# Patient Record
Sex: Female | Born: 1960 | Race: Black or African American | Hispanic: No | Marital: Single | Smoking: Never smoker
Health system: Southern US, Community
[De-identification: ages and names within clinical notes are randomized; demographics above are authoritative.]

## PROBLEM LIST (undated history)

## (undated) DIAGNOSIS — I10 Essential (primary) hypertension: Secondary | ICD-10-CM

---

## 2008-04-01 ENCOUNTER — Emergency Department: Payer: Self-pay | Admitting: Emergency Medicine

## 2008-04-08 ENCOUNTER — Ambulatory Visit: Payer: Self-pay | Admitting: Family Medicine

## 2013-10-15 ENCOUNTER — Emergency Department: Payer: Self-pay | Admitting: Emergency Medicine

## 2013-10-15 LAB — BASIC METABOLIC PANEL
ANION GAP: 5 — AB (ref 7–16)
BUN: 13 mg/dL (ref 7–18)
CHLORIDE: 106 mmol/L (ref 98–107)
Calcium, Total: 8.5 mg/dL (ref 8.5–10.1)
Co2: 26 mmol/L (ref 21–32)
Creatinine: 0.62 mg/dL (ref 0.60–1.30)
EGFR (African American): >60
EGFR (Non-African Amer.): >60
Glucose: 128 mg/dL — ABNORMAL HIGH (ref 65–99)
OSMOLALITY: 276 (ref 275–301)
POTASSIUM: 3.3 mmol/L — AB (ref 3.5–5.1)
SODIUM: 137 mmol/L (ref 136–145)

## 2013-10-15 LAB — CBC WITH DIFFERENTIAL/PLATELET
BASOS PCT: 0.7 %
Basophil #: 0.1 10*3/uL (ref 0.0–0.1)
EOS ABS: 0.1 10*3/uL (ref 0.0–0.7)
Eosinophil %: 0.9 %
HCT: 35.8 % (ref 35.0–47.0)
HGB: 12 g/dL (ref 12.0–16.0)
LYMPHS ABS: 2.2 10*3/uL (ref 1.0–3.6)
Lymphocyte %: 19 %
MCH: 29.5 pg (ref 26.0–34.0)
MCHC: 33.6 g/dL (ref 32.0–36.0)
MCV: 88 fL (ref 80–100)
MONO ABS: 0.9 x10 3/mm (ref 0.2–0.9)
Monocyte %: 8.2 %
Neutrophil #: 8.1 10*3/uL — ABNORMAL HIGH (ref 1.4–6.5)
Neutrophil %: 71.2 %
PLATELETS: 219 10*3/uL (ref 150–440)
RBC: 4.08 10*6/uL (ref 3.80–5.20)
RDW: 15.1 % — ABNORMAL HIGH (ref 11.5–14.5)
WBC: 11.3 10*3/uL — ABNORMAL HIGH (ref 3.6–11.0)

## 2013-10-17 LAB — BETA STREP CULTURE(ARMC)

## 2013-10-20 LAB — CULTURE, BLOOD (SINGLE)

## 2014-05-22 ENCOUNTER — Ambulatory Visit: Payer: Self-pay

## 2015-08-29 ENCOUNTER — Ambulatory Visit
Admission: EM | Admit: 2015-08-29 | Discharge: 2015-08-29 | Disposition: A | Discharge status: Home / Self Care | Payer: BLUE CROSS/BLUE SHIELD | Attending: Family Medicine | Admitting: Family Medicine

## 2015-08-29 ENCOUNTER — Ambulatory Visit (INDEPENDENT_AMBULATORY_CARE_PROVIDER_SITE_OTHER): Payer: BLUE CROSS/BLUE SHIELD

## 2015-08-29 DIAGNOSIS — M25531 Pain in right wrist: Secondary | ICD-10-CM | POA: Diagnosis not present

## 2015-08-29 DIAGNOSIS — S66911A Strain of unspecified muscle, fascia and tendon at wrist and hand level, right hand, initial encounter: Secondary | ICD-10-CM

## 2015-08-29 DIAGNOSIS — M778 Other enthesopathies, not elsewhere classified: Secondary | ICD-10-CM

## 2015-08-29 HISTORY — DX: Essential (primary) hypertension: I10

## 2015-08-29 MED ORDER — MELOXICAM 15 MG PO TABS: 15.0000 mg | ORAL_TABLET | Freq: Every day | ORAL | Status: DC

## 2015-08-29 MED ORDER — TRAMADOL HCL 50 MG PO TABS: 50.0000 mg | ORAL_TABLET | Freq: Two times a day (BID) | ORAL | Status: DC | PRN

## 2015-08-29 NOTE — ED Provider Notes (Signed)
CSN: [id number]     Arrival date & time 08/29/15  1348 History   First MD Initiated Contact with Patient 08/29/15 1622    Nurses notes were reviewed. Chief Complaint  Patient presents with  . Wrist Pain   patient started having right wrist pain while at work. She uses her hand and wrist. States that right wrist pain started bothering her while she was at work. Tylenol she did not file any paperwork at work. She had one friend who thought this had tendinitis but not a friend who disputed that could be an issue. The right wrist and distal forearm became warm and hot and the patient became concerned and came in to be seen and evaluated. She denies any specific injury to the right wrist but states she does use her wrist and hand a lot at work and sometimes she does hit her work table with the wrist and hand.  She denies any drug allergies she does not smoke. She has history of hypertension and father has a history of an unknown cancer.  (Consider location/radiation/quality/duration/timing/severity/associated sxs/prior Treatment) Patient is a 55 y.o. female presenting with wrist pain. The history is provided by the patient. No language interpreter was used.  Wrist Pain This is a new problem. The current episode started more than 2 days ago. The problem occurs constantly. The problem has been gradually worsening. Pertinent negatives include no chest pain, no abdominal pain, no headaches and no shortness of breath. The symptoms are aggravated by exertion and bending. Nothing relieves the symptoms. She has tried nothing for the symptoms. The treatment provided no relief.    Past Medical History  Diagnosis Date  . Hypertension    History reviewed. No pertinent past surgical history. Family History  Problem Relation Age of Onset  . Cancer Father    Social History  Substance Use Topics  . Smoking status: Never Smoker   . Smokeless tobacco: None  . Alcohol Use: No   OB History    No data  available     Review of Systems  Respiratory: Negative for shortness of breath.   Cardiovascular: Negative for chest pain.  Gastrointestinal: Negative for abdominal pain.  Neurological: Negative for headaches.  All other systems reviewed and are negative.   Allergies  Review of patient's allergies indicates no known allergies.  Home Medications   Prior to Admission medications   Medication Sig Start Date End Date Taking? Authorizing Provider  metoprolol-hydrochlorothiazide (LOPRESSOR HCT) 100-25 MG tablet Take 1 tablet by mouth daily.   Yes Historical Provider, MD  meloxicam (MOBIC) 15 MG tablet Take 1 tablet (15 mg total) by mouth daily. 08/29/15   Frederich Cha, MD  traMADol (ULTRAM) 50 MG tablet Take 1 tablet (50 mg total) by mouth every 12 (twelve) hours as needed for moderate pain or severe pain (Many take at night for pain may cause sedation during the day). 08/29/15   Frederich Cha, MD   Meds Ordered and Administered this Visit  Medications - No data to display  BP 157/108 mmHg  Pulse 80  Temp(Src) 98.1 F (36.7 C) (Tympanic)  Resp 18  Ht 5\' 4"  (1.626 m)  Wt 289 lb (131.09 kg)  BMI 49.58 kg/m2  SpO2 100%  LMP 08/12/2015 (Approximate) No data found.   Physical Exam  Constitutional: She is oriented to person, place, and time. She appears well-developed and well-nourished.  Non-toxic appearance. She does not have a sickly appearance.  Obese black female  HENT:  Head: Normocephalic and  atraumatic.  Eyes: Conjunctivae are normal. Pupils are equal, round, and reactive to light.  Musculoskeletal: She exhibits edema and tenderness.       Right wrist: She exhibits tenderness and swelling. She exhibits no deformity.       Arms: Patient has swelling in the right wrist. There is no tenderness over the anatomical snuffbox with right wrist but over the ulnar side and medial side of the right wrist and distal forearm past with the tenderness is at and is actually some swelling and  warmth in this area as well. X-ray of the right wrist though was negative for any type of occult fracture or dislocation.  Neurological: She is alert and oriented to person, place, and time.  Skin: Skin is warm and dry.  Psychiatric: She has a normal mood and affect.  Vitals reviewed.   ED Course  Procedures (including critical care time)  Labs Review Labs Reviewed - No data to display  Imaging Review Dg Wrist Complete Right  08/29/2015  CLINICAL DATA:  Pt with right ulnar/medial wrist pain x two days. No known trauma or injury. Pain with swelling. No hx of trauma or injury. No hx of surgery. EXAM: RIGHT WRIST - COMPLETE 3+ VIEW COMPARISON:  None. FINDINGS: There is no evidence of fracture or dislocation. There is no evidence of arthropathy or other focal bone abnormality. Soft tissues are unremarkable. IMPRESSION: Negative. Electronically Signed   By: Lajean Manes M.D.   On: 08/29/2015 15:30     Visual Acuity Review  Right Eye Distance:   Left Eye Distance:   Bilateral Distance:    Right Eye Near:   Left Eye Near:    Bilateral Near:         MDM   1. Wrist pain, acute, right   2. Tendonitis of wrist, right   3. Overuse syndrome of right hand, initial encounter    Pressure is elevate. Will give her a work note for Saturday Sunday Monday return to work on Tuesday. We'll place the cockup wrist splint. Placed on Mobic 15 mg and tramadol to use at night mostly to help with the pain in the wrist is interfering with her sleeping. If she is not better by Monday S tolerance test she contact the PCP of her choice to be this rechecked and reevaluated.    Frederich Cha, MD 08/29/15 [phone]

## 2015-08-29 NOTE — ED Notes (Signed)
Started Monday with right wrist pain. Improved slightly but Wednesday night right wrist began swelling "throbbing". Difficulty moving fingers or wrist. Denies trauma. No bite (insect) marks noted

## 2015-08-29 NOTE — Discharge Instructions (Signed)
Cryotherapy Cryotherapy is when you put ice on your injury. Ice helps lessen pain and puffiness (swelling) after an injury. Ice works the best when you start using it in the first 24 to 48 hours after an injury. HOME CARE  Put a dry or damp towel between the ice pack and your skin.  You may press gently on the ice pack.  Leave the ice on for no more than 10 to 20 minutes at a time.  Check your skin after 5 minutes to make sure your skin is okay.  Rest at least 20 minutes between ice pack uses.  Stop using ice when your skin loses feeling (numbness).  Do not use ice on someone who cannot tell you when it hurts. This includes small children and people with memory problems (dementia). GET HELP RIGHT AWAY IF:  You have white spots on your skin.  Your skin turns blue or pale.  Your skin feels waxy or hard.  Your puffiness gets worse. MAKE SURE YOU:   Understand these instructions.  Will watch your condition.  Will get help right away if you are not doing well or get worse.   This information is not intended to replace advice given to you by your health care provider. Make sure you discuss any questions you have with your health care provider.   Document Released: 12/14/2007 Document Revised: 09/19/2011 Document Reviewed: 02/17/2011 Elsevier Interactive Patient Education 2016 [place].  Muscle Strain A muscle strain (pulled muscle) happens when a muscle is stretched beyond normal length. It happens when a sudden, violent force stretches your muscle too far. Usually, a few of the fibers in your muscle are torn. Muscle strain is common in athletes. Recovery usually takes 1-2 weeks. Complete healing takes 5-6 weeks.  HOME CARE   Follow the PRICE method of treatment to help your injury get better. Do this the first 2-3 days after the injury:  Protect. Protect the muscle to keep it from getting injured again.  Rest. Limit your activity and rest the injured body part.  Ice.  Put ice in a plastic bag. Place a towel between your skin and the bag. Then, apply the ice and leave it on from 15-20 minutes each hour. After the third day, switch to moist heat packs.  Compression. Use a splint or elastic bandage on the injured area for comfort. Do not put it on too tightly.  Elevate. Keep the injured body part above the level of your heart.  Only take medicine as told by your doctor.  Warm up before doing exercise to prevent future muscle strains. GET HELP IF:   You have more pain or puffiness (swelling) in the injured area.  You feel numbness, tingling, or notice a loss of strength in the injured area. MAKE SURE YOU:   Understand these instructions.  Will watch your condition.  Will get help right away if you are not doing well or get worse.   This information is not intended to replace advice given to you by your health care provider. Make sure you discuss any questions you have with your health care provider.   Document Released: 04/05/2008 Document Revised: 04/17/2013 Document Reviewed: 01/24/2013 Elsevier Interactive Patient Education 2016 [employer].  Joint Pain Joint pain can be caused by many things. The joint can be bruised, infected, weak from aging, or sore from exercise. The pain will probably go away if you follow your doctor's instructions for home care. If your joint pain continues, more tests  may be needed to help find the cause of your condition. HOME CARE Watch your condition for any changes. Follow these instructions as told to lessen the pain that you are feeling:  Take medicines only as told by your doctor.  Rest the sore joint for as long as told by your doctor. If your doctor tells you to, raise (elevate) the painful joint above the level of your heart while you are sitting or lying down.  Do not do things that cause pain or make the pain worse.  If told, put ice on the painful area:  Put ice in a plastic bag.  Place a towel  between your skin and the bag.  Leave the ice on for 20 minutes, 2-3 times per day.  Wear an elastic bandage, splint, or sling as told by your doctor. Loosen the bandage or splint if your fingers or toes lose feeling (become numb) and tingle, or if they turn cold and blue.  Begin exercising or stretching the joint as told by your doctor. Ask your doctor what types of exercise are safe for you.  Keep all follow-up visits as told by your doctor. This is important. GET HELP IF:  Your pain gets worse and medicine does not help it.  Your joint pain does not get better in 3 days.  You have more bruising or swelling.  You have a fever.  You lose 10 pounds (4.5 kg) or more without trying. GET HELP RIGHT AWAY IF:  You are not able to move the joint.  Your fingers or toes become numb or they turn cold and blue.   This information is not intended to replace advice given to you by your health care provider. Make sure you discuss any questions you have with your health care provider.   Document Released: 06/15/2009 Document Revised: 07/18/2014 Document Reviewed: 04/08/2014 Elsevier Interactive Patient Education 2016 [employer]  Wrist Pain There are many things that can cause wrist pain. Some common causes include:  An injury to the wrist area.  Overuse of the joint.  A condition that causes too much pressure to be put on a nerve in the wrist (carpal tunnel syndrome).  Wear and tear of the joints that happens as a person gets older (osteoarthritis).  Other types of arthritis. Sometimes, the cause is not known. The pain often goes away when you follow instructions from your doctor about relieving pain at home. If your wrist pain does not go away, tests may need to be done to find the cause. HOME CARE Pay attention to any changes in your symptoms. Take these actions to help with your pain:  Rest your wrist for at least 48 hours or as told by your doctor.  If your doctor tells you  to, put ice on the injured area:  Put ice in a plastic bag.  Place a towel between your skin and the bag.  Leave the ice on for 20 minutes, 2-3 times per day.  Keep your arm raised (elevated) above the level of your heart while you are sitting or lying down.  If a splint or elastic bandage has been put on the injured area:  Wear it as told by your doctor.  Take the splint or bandage off only as told by your doctor.  Loosen the splint or bandage if your fingers lose feeling (are numb) or have a tingling feeling, or if they turn cold or blue.  Take over-the-counter and prescription medicines only as told by  your doctor.  Keep all follow-up visits as told by your doctor. This is important. GET HELP IF:  Your pain is not helped by treatment.  Your pain gets worse. GET HELP RIGHT AWAY IF:   Your fingers swell.  Your fingers turn white, very red, or cold and blue.  Your fingers lose feeling or have a tingling feeling.  You have trouble moving your fingers.   This information is not intended to replace advice given to you by your health care provider. Make sure you discuss any questions you have with your health care provider.   Document Released: 12/14/2007 Document Revised: 03/18/2015 Document Reviewed: 11/12/2014 Elsevier Interactive Patient Education [employer].

## 2016-02-12 ENCOUNTER — Encounter: Payer: Self-pay | Admitting: *Deleted

## 2016-02-15 ENCOUNTER — Encounter
Admission: RE | Disposition: A | Discharge status: Home / Self Care | Payer: Self-pay | Source: Ambulatory Visit | Attending: Gastroenterology

## 2016-02-15 ENCOUNTER — Encounter: Payer: Self-pay | Admitting: Anesthesiology

## 2016-02-15 ENCOUNTER — Ambulatory Visit
Admission: RE | Admit: 2016-02-15 | Discharge: 2016-02-15 | Disposition: A | Discharge status: Home / Self Care | Payer: 59 | Source: Ambulatory Visit | Attending: Gastroenterology | Admitting: Gastroenterology

## 2016-02-15 ENCOUNTER — Ambulatory Visit: Payer: 59 | Admitting: Certified Registered Nurse Anesthetist

## 2016-02-15 DIAGNOSIS — Z79899 Other long term (current) drug therapy: Secondary | ICD-10-CM | POA: Insufficient documentation

## 2016-02-15 DIAGNOSIS — I10 Essential (primary) hypertension: Secondary | ICD-10-CM | POA: Insufficient documentation

## 2016-02-15 DIAGNOSIS — K640 First degree hemorrhoids: Secondary | ICD-10-CM | POA: Diagnosis not present

## 2016-02-15 DIAGNOSIS — Z800 Family history of malignant neoplasm of digestive organs: Secondary | ICD-10-CM | POA: Diagnosis not present

## 2016-02-15 DIAGNOSIS — Z1211 Encounter for screening for malignant neoplasm of colon: Secondary | ICD-10-CM | POA: Diagnosis not present

## 2016-02-15 DIAGNOSIS — D123 Benign neoplasm of transverse colon: Secondary | ICD-10-CM | POA: Insufficient documentation

## 2016-02-15 DIAGNOSIS — K5730 Diverticulosis of large intestine without perforation or abscess without bleeding: Secondary | ICD-10-CM | POA: Diagnosis not present

## 2016-02-15 DIAGNOSIS — K621 Rectal polyp: Secondary | ICD-10-CM | POA: Diagnosis not present

## 2016-02-15 HISTORY — PX: COLONOSCOPY WITH PROPOFOL: SHX5780

## 2016-02-15 SURGERY — COLONOSCOPY WITH PROPOFOL
Anesthesia: General

## 2016-02-15 MED ORDER — LIDOCAINE HCL (CARDIAC) 20 MG/ML IV SOLN
INTRAVENOUS | Status: DC | PRN
  Administered 2016-02-15: 20 mg via INTRAVENOUS

## 2016-02-15 MED ORDER — SODIUM CHLORIDE 0.9 % IV SOLN: INTRAVENOUS | Status: DC

## 2016-02-15 MED ORDER — SODIUM CHLORIDE 0.9 % IV SOLN
INTRAVENOUS | Status: DC
  Administered 2016-02-15: 1000 mL via INTRAVENOUS

## 2016-02-15 MED ORDER — GLYCOPYRROLATE 0.2 MG/ML IJ SOLN
INTRAMUSCULAR | Status: DC | PRN
  Administered 2016-02-15: 0.2 mg via INTRAVENOUS

## 2016-02-15 MED ORDER — PROPOFOL 500 MG/50ML IV EMUL
INTRAVENOUS | Status: DC | PRN
  Administered 2016-02-15: 140 ug/kg/min via INTRAVENOUS

## 2016-02-15 MED ORDER — PROPOFOL 10 MG/ML IV BOLUS
INTRAVENOUS | Status: DC | PRN
  Administered 2016-02-15 (×3): 20 mg via INTRAVENOUS

## 2016-02-15 MED ORDER — DEXMEDETOMIDINE HCL IN NACL 200 MCG/50ML IV SOLN
INTRAVENOUS | Status: DC | PRN
  Administered 2016-02-15 (×2): 8 ug via INTRAVENOUS
  Administered 2016-02-15: 4 ug via INTRAVENOUS

## 2016-02-15 NOTE — Op Note (Signed)
[facility] Gastroenterology Patient Name: Tammy Mahoney Procedure Date: 02/15/2016 9:54 AM MRN: [id number] Account #: [id number] Date of Birth: February 05, 1961 Admit Type: Outpatient Age: 55 Room: [facility] ENDO ROOM 4 Gender: Female Note Status: Finalized Procedure:            Colonoscopy Indications:          Family history of colon cancer in a first-degree                        relative Providers:            Lollie Sails, MD Referring MD:         Rubbie Battiest. Iona Beard MD, MD (Referring MD) Medicines:            Monitored Anesthesia Care Complications:        No immediate complications. Procedure:            Pre-Anesthesia Assessment:                       - ASA Grade Assessment: III - A patient with severe                        systemic disease.                       After obtaining informed consent, the colonoscope was                        passed under direct vision. Throughout the procedure,                        the patient's blood pressure, pulse, and oxygen                        saturations were monitored continuously. The                        Colonoscope was introduced through the anus and                        advanced to the the cecum, identified by appendiceal                        orifice and ileocecal valve. The colonoscopy was                        performed without difficulty. The patient tolerated the                        procedure well. The quality of the bowel preparation                        was fair. Findings:      A 3 mm polyp was found in the hepatic flexure. The polyp was sessile.       The polyp was removed with a cold biopsy forceps. Resection and       retrieval were complete.      Two sessile polyps were found in the rectum. The polyps were 1 to 2 mm       in size. These polyps were removed with a cold biopsy forceps.  Resection       and retrieval were complete.      A few small-mouthed diverticula were found in the sigmoid  colon.      Non-bleeding internal hemorrhoids were found during anoscopy. The       hemorrhoids were small and Grade I (internal hemorrhoids that do not       prolapse). Impression:           - Preparation of the colon was fair.                       - One 3 mm polyp at the hepatic flexure, removed with a                        cold biopsy forceps. Resected and retrieved.                       - Two 1 to 2 mm polyps in the rectum, removed with a                        cold biopsy forceps. Resected and retrieved.                       - Diverticulosis in the sigmoid colon.                       - Non-bleeding internal hemorrhoids. Recommendation:       - Await pathology results.                       - Telephone GI clinic for pathology results in 1 week. Procedure Code(s):    --- Professional ---                       [id number], Colonoscopy, flexible; with biopsy, single or                        multiple Diagnosis Code(s):    --- Professional ---                       K64.0, First degree hemorrhoids                       D12.3, Benign neoplasm of transverse colon (hepatic                        flexure or splenic flexure)                       K62.1, Rectal polyp                       Z80.0, Family history of malignant neoplasm of                        digestive organs                       K57.30, Diverticulosis of large intestine without                        perforation or abscess without bleeding CPT copyright 2016 American Medical Association. All rights  reserved. The codes documented in this report are preliminary and upon coder review may  be revised to meet current compliance requirements. Lollie Sails, MD 02/15/2016 10:27:21 AM This report has been signed electronically. Number of Addenda: 0 Note Initiated On: 02/15/2016 9:54 AM Scope Withdrawal Time: 0 hours 8 minutes 45 seconds  Total Procedure Duration: 0 hours 18 minutes 59 seconds       [facility]

## 2016-02-15 NOTE — Anesthesia Preprocedure Evaluation (Signed)
Anesthesia Evaluation  Patient identified by MRN, date of birth, ID band Patient awake    Reviewed: Allergy & Precautions, H&P , NPO status , Patient's Chart, lab work & pertinent test results, reviewed documented beta blocker date and time   History of Anesthesia Complications Negative for: history of anesthetic complications  Airway Mallampati: I  TM Distance: >3 FB Neck ROM: full    Dental no notable dental hx. (+) Missing, Poor Dentition   Pulmonary neg pulmonary ROS,    Pulmonary exam normal breath sounds clear to auscultation       Cardiovascular Exercise Tolerance: Good hypertension, (-) angina(-) CAD, (-) Past MI, (-) Cardiac Stents and (-) CABG Normal cardiovascular exam(-) dysrhythmias (-) Valvular Problems/Murmurs Rhythm:regular Rate:Normal     Neuro/Psych negative neurological ROS  negative psych ROS   GI/Hepatic negative GI ROS, Neg liver ROS,   Endo/Other  neg diabetes  Renal/GU negative Renal ROS  negative genitourinary   Musculoskeletal   Abdominal   Peds  Hematology negative hematology ROS (+)   Anesthesia Other Findings Past Medical History: No date: Hypertension   Reproductive/Obstetrics negative OB ROS                             Anesthesia Physical Anesthesia Plan  ASA: III  Anesthesia Plan: General   Post-op Pain Management:    Induction:   Airway Management Planned:   Additional Equipment:   Intra-op Plan:   Post-operative Plan:   Informed Consent: I have reviewed the patients History and Physical, chart, labs and discussed the procedure including the risks, benefits and alternatives for the proposed anesthesia with the patient or authorized representative who has indicated his/her understanding and acceptance.   Dental Advisory Given  Plan Discussed with: Anesthesiologist, CRNA and Surgeon  Anesthesia Plan Comments:         Anesthesia  Quick Evaluation

## 2016-02-15 NOTE — Transfer of Care (Signed)
Immediate Anesthesia Transfer of Care Note  Patient: Tammy Mahoney  Procedure(s) Performed: Procedure(s): COLONOSCOPY WITH PROPOFOL (N/A)  Patient Location: PACU  Anesthesia Type:General  Level of Consciousness: awake  Airway & Oxygen Therapy: Patient connected to nasal cannula oxygen  Post-op Assessment: Report given to RN  Post vital signs: stable  Last Vitals:  Vitals:   02/15/16 0933 02/15/16 1029  BP: (!) 159/95 132/63  Pulse: 89 83  Resp: 16 17  Temp: 36.8 C 36.8 C    Last Pain:  Vitals:   02/15/16 1029  TempSrc: Tympanic         Complications: No apparent anesthesia complications

## 2016-02-15 NOTE — Anesthesia Postprocedure Evaluation (Signed)
Anesthesia Post Note  Patient: LANNA XU  Procedure(s) Performed: Procedure(s) (LRB): COLONOSCOPY WITH PROPOFOL (N/A)  Patient location during evaluation: Endoscopy Anesthesia Type: General Level of consciousness: awake and alert Pain management: pain level controlled Vital Signs Assessment: post-procedure vital signs reviewed and stable Respiratory status: spontaneous breathing, nonlabored ventilation, respiratory function stable and patient connected to nasal cannula oxygen Cardiovascular status: blood pressure returned to baseline and stable Postop Assessment: no signs of nausea or vomiting Anesthetic complications: no    Last Vitals:  Vitals:   02/15/16 1040 02/15/16 1100  BP:  (!) 149/81  Pulse:    Resp: 12   Temp:      Last Pain:  Vitals:   02/15/16 1029  TempSrc: Tympanic                 Martha Clan

## 2016-02-15 NOTE — H&P (Signed)
Outpatient short stay form Pre-procedure 02/15/2016 9:48 AM Tammy Sails MD  Primary Physician: Dr. Salome Holmes  Reason for visit:  Colonoscopy  History of present illness:  Patient is a 55 year old female presenting today for a colonoscopy. Her brother has a history of colon cancer. This is patient's first colonoscopy. She did tolerate her prep well. She takes no aspirin or blood thinning products.    Current Facility-Administered Medications:  .  0.9 %  sodium chloride infusion, , Intravenous, Continuous, Tammy Sails, MD, Last Rate: 20 mL/hr at 02/15/16 0946, 1,000 mL at 02/15/16 0946 .  0.9 %  sodium chloride infusion, , Intravenous, Continuous, Tammy Sails, MD  Prescriptions Prior to Admission  Medication Sig Dispense Refill Last Dose  . amLODipine (NORVASC) 5 MG tablet Take 5 mg by mouth daily.   02/15/2016 at [facility]  . metoprolol-hydrochlorothiazide (LOPRESSOR HCT) 100-25 MG tablet Take 1 tablet by mouth daily.   Past Week at Unknown time  . meloxicam (MOBIC) 15 MG tablet Take 1 tablet (15 mg total) by mouth daily. (Patient not taking: Reported on 02/15/2016) 30 tablet 0 Not Taking at Unknown time  . traMADol (ULTRAM) 50 MG tablet Take 1 tablet (50 mg total) by mouth every 12 (twelve) hours as needed for moderate pain or severe pain (Many take at night for pain may cause sedation during the day). (Patient not taking: Reported on 02/15/2016) 15 tablet 0 Not Taking at Unknown time     No Known Allergies   Past Medical History:  Diagnosis Date  . Hypertension     Review of systems:      Physical Exam    Heart and lungs: Regular rate and rhythm without rub or gallop, lungs are bilaterally clear    HEENT: Normocephalic atraumatic eyes are anicteric    Other:     Pertinant exam for procedure: Soft nontender nondistended bowel sounds positive normoactive.    Planned proceedures: Colonoscopy and indicated procedures I have discussed the risks benefits and  complications of procedures to include not limited to bleeding, infection, perforation and the risk of sedation and the patient wishes to proceed.    Tammy Sails, MD Gastroenterology 02/15/2016  9:48 AM

## 2016-02-16 ENCOUNTER — Encounter: Payer: Self-pay | Admitting: Gastroenterology

## 2016-02-16 LAB — SURGICAL PATHOLOGY

## 2017-05-05 ENCOUNTER — Encounter: Payer: Self-pay | Admitting: *Deleted

## 2017-05-05 ENCOUNTER — Ambulatory Visit
Admission: EM | Admit: 2017-05-05 | Discharge: 2017-05-05 | Disposition: A | Discharge status: Home / Self Care | Payer: 59 | Attending: Family Medicine | Admitting: Family Medicine

## 2017-05-05 DIAGNOSIS — J039 Acute tonsillitis, unspecified: Secondary | ICD-10-CM

## 2017-05-05 DIAGNOSIS — J020 Streptococcal pharyngitis: Secondary | ICD-10-CM

## 2017-05-05 LAB — RAPID STREP SCREEN (MED CTR MEBANE ONLY): STREPTOCOCCUS, GROUP A SCREEN (DIRECT): POSITIVE — AB

## 2017-05-05 MED ORDER — LIDOCAINE VISCOUS 2 % MT SOLN: OROMUCOSAL | 0 refills | Status: DC

## 2017-05-05 MED ORDER — AMOXICILLIN 875 MG PO TABS: 875.0000 mg | ORAL_TABLET | Freq: Two times a day (BID) | ORAL | 0 refills | Status: DC

## 2017-05-05 NOTE — ED Triage Notes (Signed)
Patient started having symptom of sore throat yesterday.

## 2017-05-05 NOTE — ED Provider Notes (Signed)
[facility]    CSN: [id number] Arrival date & time: 05/05/17  1010     History   Chief Complaint Chief Complaint  Patient presents with  . Sore Throat    HPI Tammy Mahoney is a 56 y.o. female.   The history is provided by the patient.  Sore Throat  This is a new problem. The current episode started yesterday. The problem occurs constantly. The problem has not changed since onset.Pertinent negatives include no chest pain, no abdominal pain, no headaches and no shortness of breath. The symptoms are aggravated by swallowing. The symptoms are relieved by acetaminophen.    Past Medical History:  Diagnosis Date  . Hypertension     There are no active problems to display for this patient.   Past Surgical History:  Procedure Laterality Date  . COLONOSCOPY WITH PROPOFOL N/A 02/15/2016   Procedure: COLONOSCOPY WITH PROPOFOL;  Surgeon: Lollie Sails, MD;  Location: [facility] ENDOSCOPY;  Service: Endoscopy;  Laterality: N/A;    OB History    No data available       Home Medications    Prior to Admission medications   Medication Sig Start Date End Date Taking? Authorizing Provider  amLODipine (NORVASC) 5 MG tablet Take 5 mg by mouth daily.   Yes [provider]  metoprolol-hydrochlorothiazide (LOPRESSOR HCT) 100-25 MG tablet Take 1 tablet by mouth daily.   Yes [provider]  amoxicillin (AMOXIL) 875 MG tablet Take 1 tablet (875 mg total) by mouth 2 (two) times daily. 05/05/17   Norval Gable, MD  lidocaine (XYLOCAINE) 2 % solution 20 ml gargle and spit q 6 hours as needed 05/05/17   Norval Gable, MD  meloxicam (MOBIC) 15 MG tablet Take 1 tablet (15 mg total) by mouth daily. Patient not taking: Reported on 02/15/2016 08/29/15   Frederich Cha, MD  traMADol (ULTRAM) 50 MG tablet Take 1 tablet (50 mg total) by mouth every 12 (twelve) hours as needed for moderate pain or severe pain (Many take at night for pain may cause sedation during the  day). Patient not taking: Reported on 02/15/2016 08/29/15   Frederich Cha, MD    Family History Family History  Problem Relation Age of Onset  . Cancer Father     Social History Social History  Substance Use Topics  . Smoking status: Never Smoker  . Smokeless tobacco: Never Used  . Alcohol use No     Allergies   Patient has no known allergies.   Review of Systems Review of Systems  Respiratory: Negative for shortness of breath.   Cardiovascular: Negative for chest pain.  Gastrointestinal: Negative for abdominal pain.  Neurological: Negative for headaches.     Physical Exam Triage Vital Signs ED Triage Vitals  Enc Vitals Group     BP 05/05/17 1033 (!) 149/87     Pulse Rate 05/05/17 1033 92     Resp 05/05/17 1033 16     Temp 05/05/17 1033 98.5 F (36.9 C)     Temp Source 05/05/17 1033 Oral     SpO2 05/05/17 1033 96 %     Weight 05/05/17 1035 283 lb (128.4 kg)     Height 05/05/17 1035 5\' 4"  (1.626 m)     Head Circumference --      Peak Flow --      Pain Score 05/05/17 1035 5     Pain Loc --      Pain Edu? --      Excl. in  [place]? --    No data found.   Updated Vital Signs BP (!) 149/87 (BP Location: Left Arm)   Pulse 92   Temp 98.5 F (36.9 C) (Oral)   Resp 16   Ht 5\' 4"  (1.626 m)   Wt 283 lb (128.4 kg)   LMP 04/05/2017   SpO2 96%   BMI 48.58 kg/m   Visual Acuity Right Eye Distance:   Left Eye Distance:   Bilateral Distance:    Right Eye Near:   Left Eye Near:    Bilateral Near:     Physical Exam  Constitutional: She appears well-developed and well-nourished. No distress.  HENT:  Head: Normocephalic and atraumatic.  Right Ear: Tympanic membrane, external ear and ear canal normal.  Left Ear: Tympanic membrane, external ear and ear canal normal.  Nose: No mucosal edema, rhinorrhea, nose lacerations, sinus tenderness, nasal deformity, septal deviation or nasal septal hematoma. No epistaxis.  No foreign bodies. Right sinus exhibits no maxillary  sinus tenderness and no frontal sinus tenderness. Left sinus exhibits no maxillary sinus tenderness and no frontal sinus tenderness.  Mouth/Throat: Uvula is midline and mucous membranes are normal. Oropharyngeal exudate and posterior oropharyngeal erythema present. No posterior oropharyngeal edema or tonsillar abscesses. Tonsillar exudate.  Eyes: Pupils are equal, round, and reactive to light. Conjunctivae and EOM are normal. Right eye exhibits no discharge. Left eye exhibits no discharge. No scleral icterus.  Neck: Normal range of motion. Neck supple. No thyromegaly present.  Cardiovascular: Normal rate, regular rhythm and normal heart sounds.   Pulmonary/Chest: Effort normal and breath sounds normal. No respiratory distress. She has no wheezes. She has no rales.  Lymphadenopathy:    She has no cervical adenopathy.  Skin: She is not diaphoretic.  Nursing note and vitals reviewed.    UC Treatments / Results  Labs (all labs ordered are listed, but only abnormal results are displayed) Labs Reviewed  RAPID STREP SCREEN (NOT AT [facility]) - Abnormal; Notable for the following:       Result Value   Streptococcus, Group A Screen (Direct) POSITIVE (*)    All other components within normal limits    EKG  EKG Interpretation None       Radiology No results found.  Procedures Procedures (including critical care time)  Medications Ordered in UC Medications - No data to display   Initial Impression / Assessment and Plan / UC Course  I have reviewed the triage vital signs and the nursing notes.  Pertinent labs & imaging results that were available during my care of the patient were reviewed by me and considered in my medical decision making (see chart for details).       Final Clinical Impressions(s) / UC Diagnoses   Final diagnoses:  Tonsillitis  Strep throat    New Prescriptions Discharge Medication List as of 05/05/2017 11:55 AM    START taking these medications   Details   amoxicillin (AMOXIL) 875 MG tablet Take 1 tablet (875 mg total) by mouth 2 (two) times daily., Starting Fri 05/05/2017, Normal    lidocaine (XYLOCAINE) 2 % solution 20 ml gargle and spit q 6 hours as needed, Normal        1. Lab results and diagnosis reviewed with patient 2. rx as per orders above; reviewed possible side effects, interactions, risks and benefits  3. Recommend supportive treatment with fluids, otc analgesics 4. Follow-up prn if symptoms worsen or don't improve  Controlled Substance Prescriptions [place] Controlled Substance Registry consulted? Not Applicable  Norval Gable, MD 05/05/17 1326

## 2017-05-05 NOTE — Discharge Instructions (Signed)
Tylenol and/or advil as needed

## 2017-08-06 IMAGING — CR DG WRIST COMPLETE 3+V*R*
4 series · 4 of 4 positions shown · non-contrast
Comparison: None.

CLINICAL DATA: Pt with right ulnar/medial wrist pain x two days. No
known trauma or injury. Pain with swelling. No hx of trauma or
injury. No hx of surgery.

EXAM:
RIGHT WRIST - COMPLETE 3+ VIEW

[wrist pa]
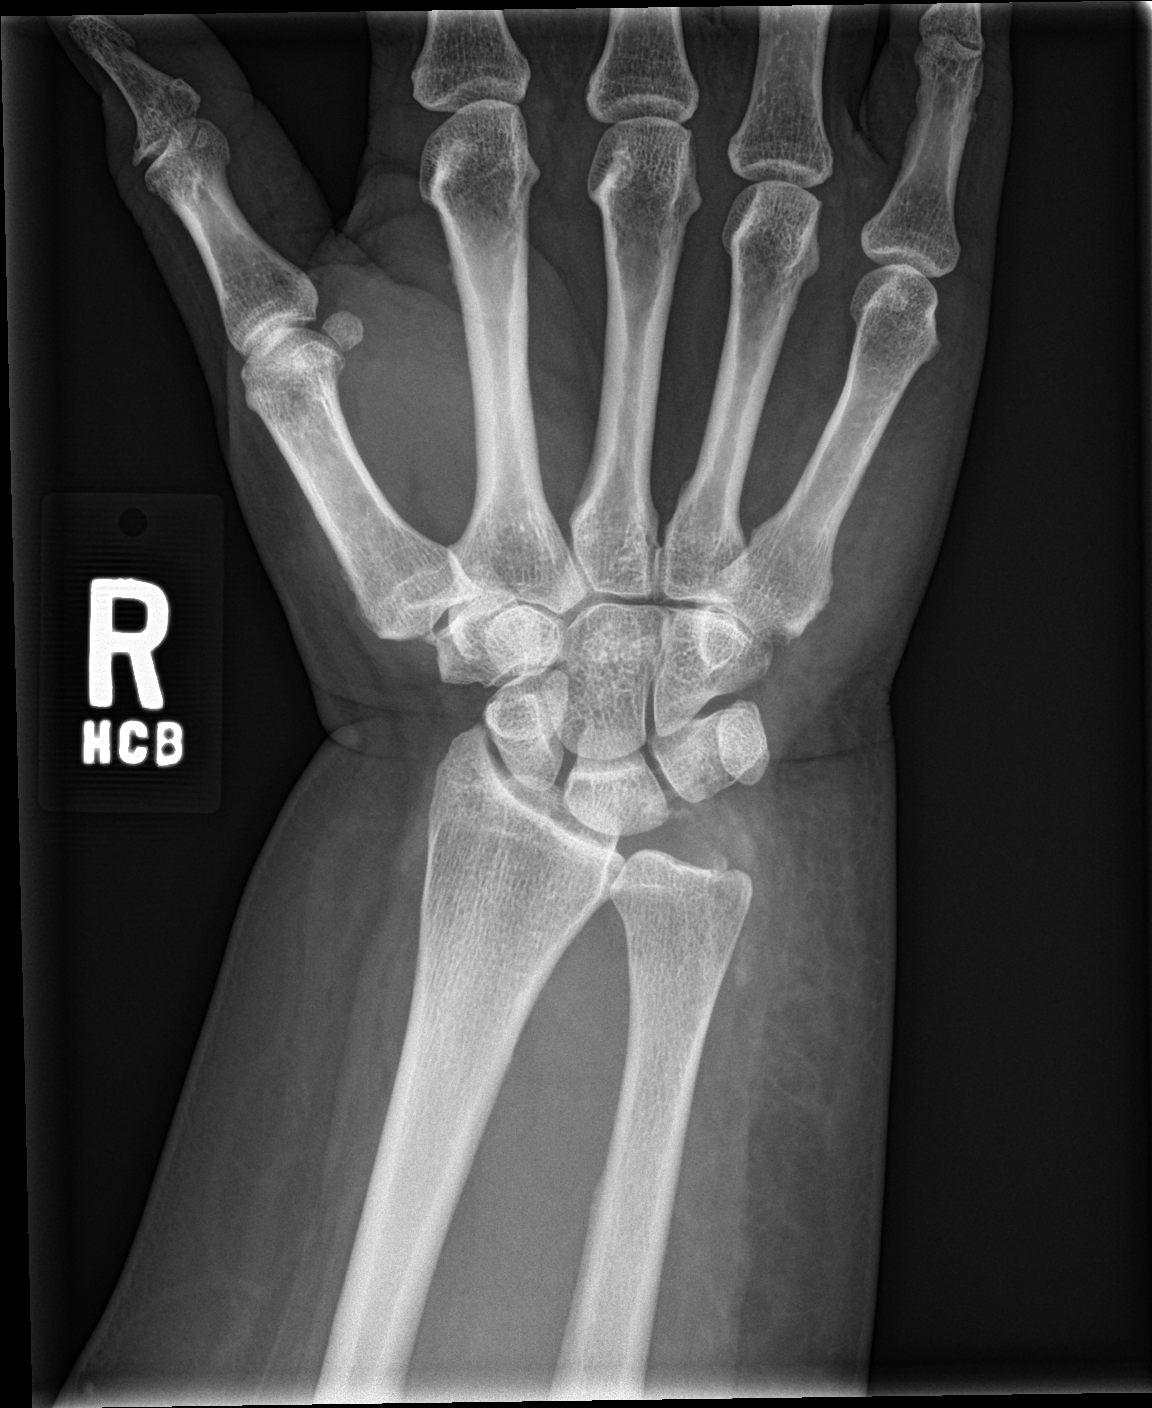

[wrist obl]
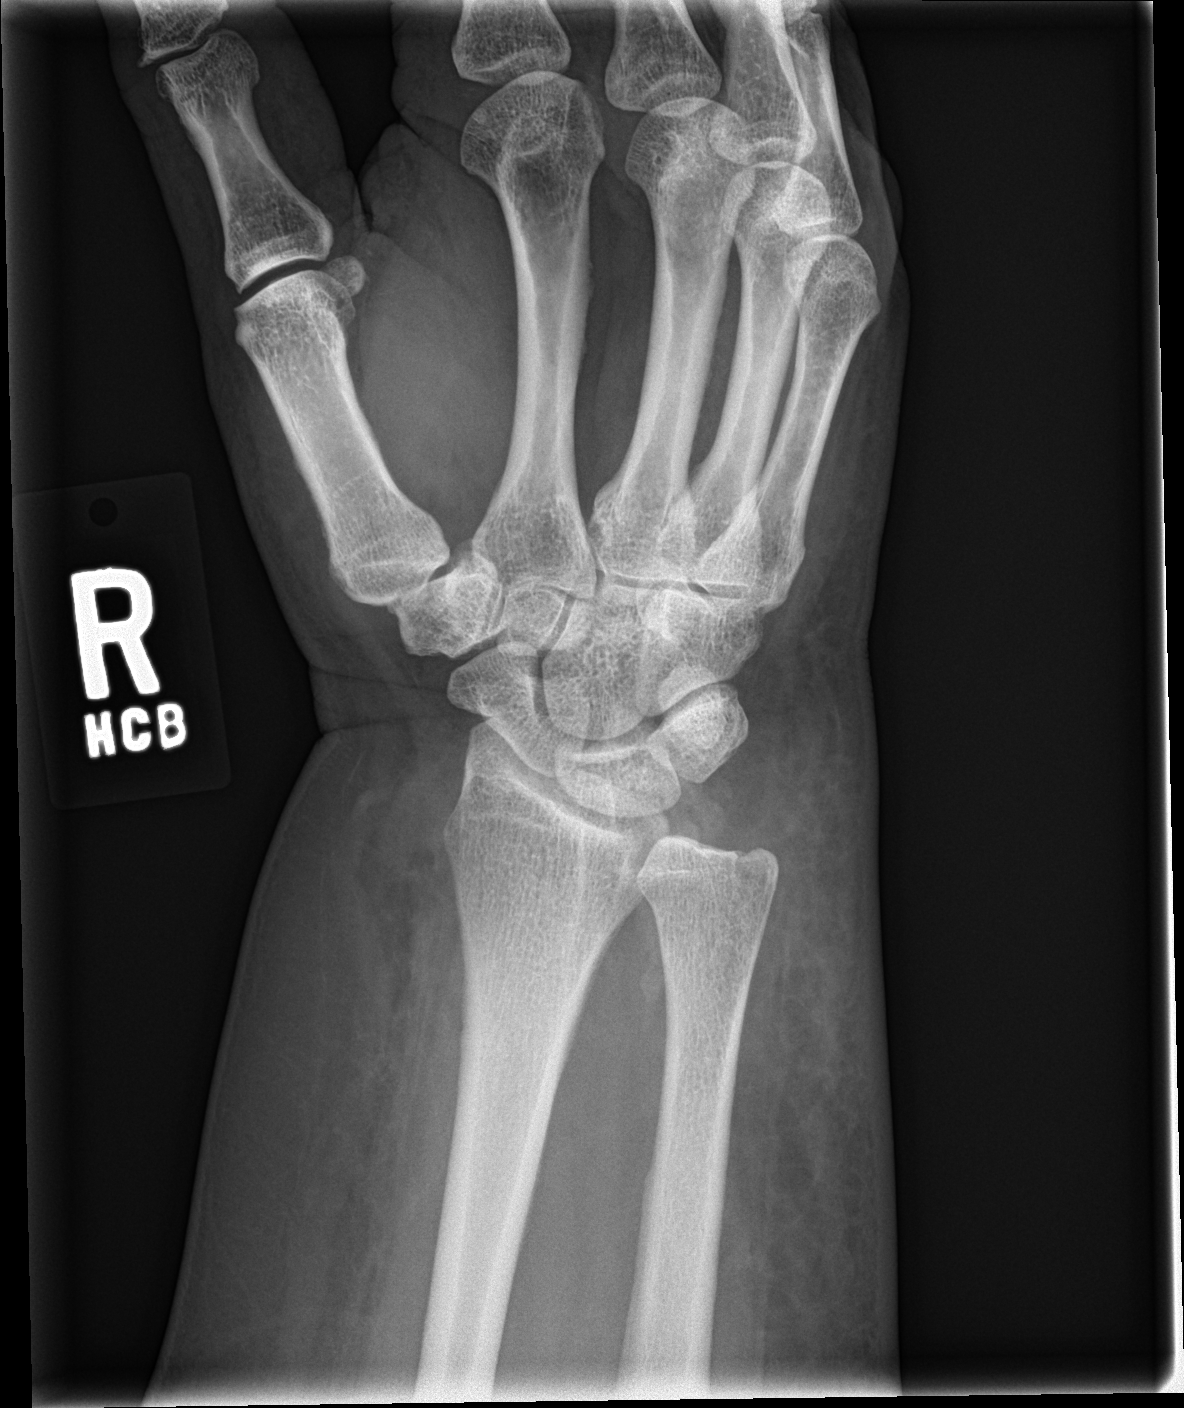

[wrist lat]
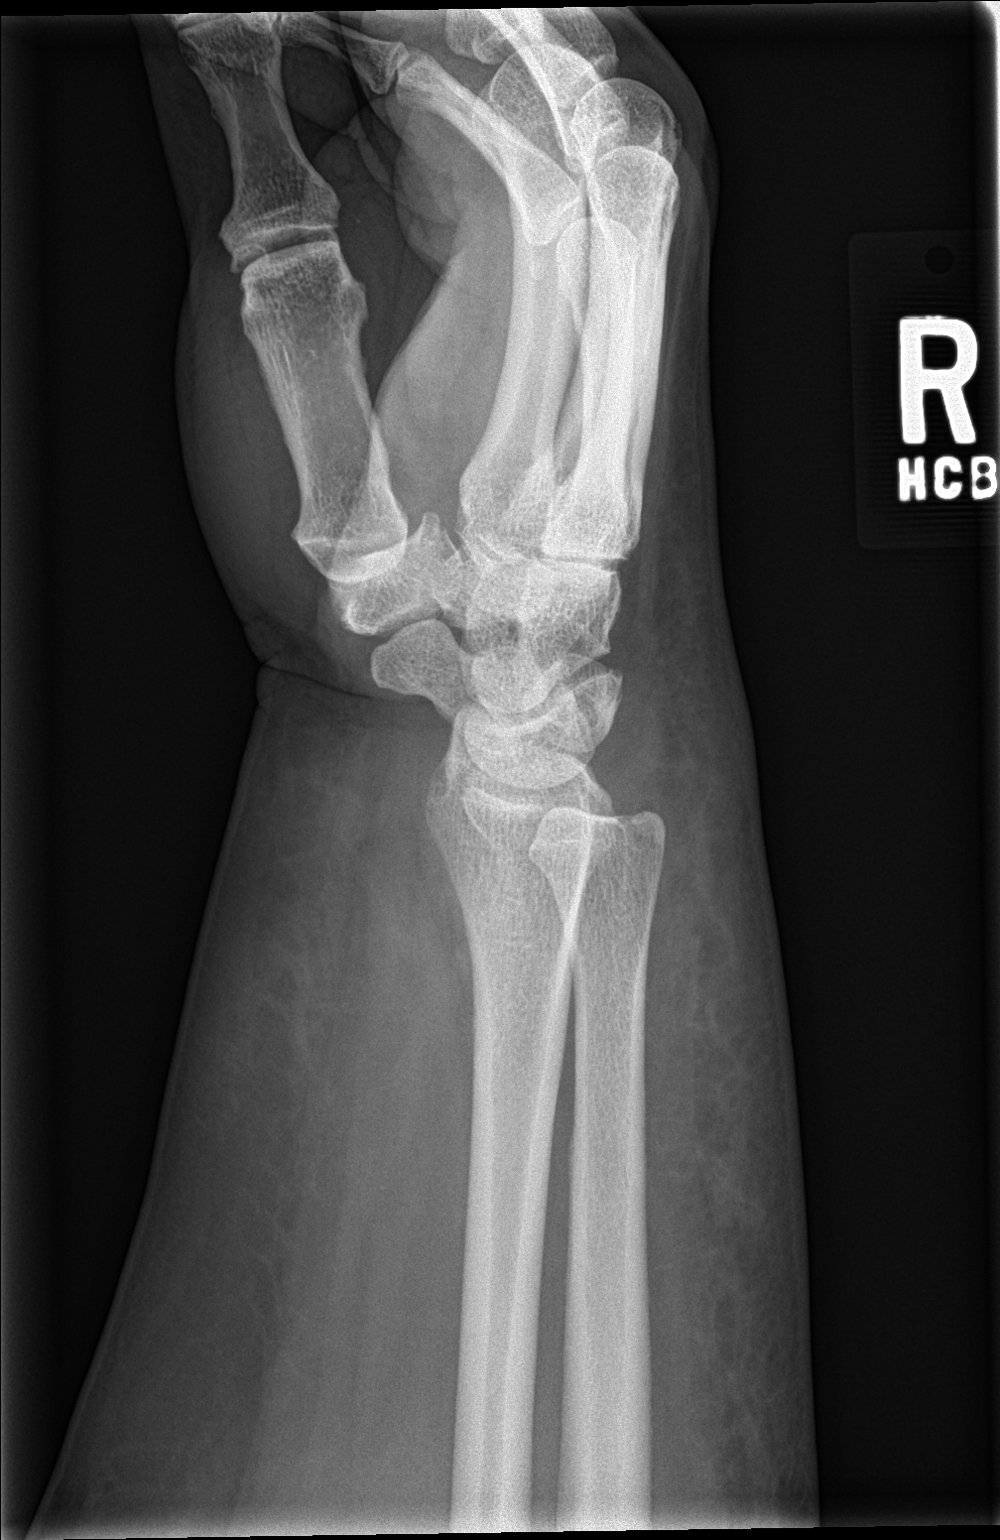

[wrist navicular]
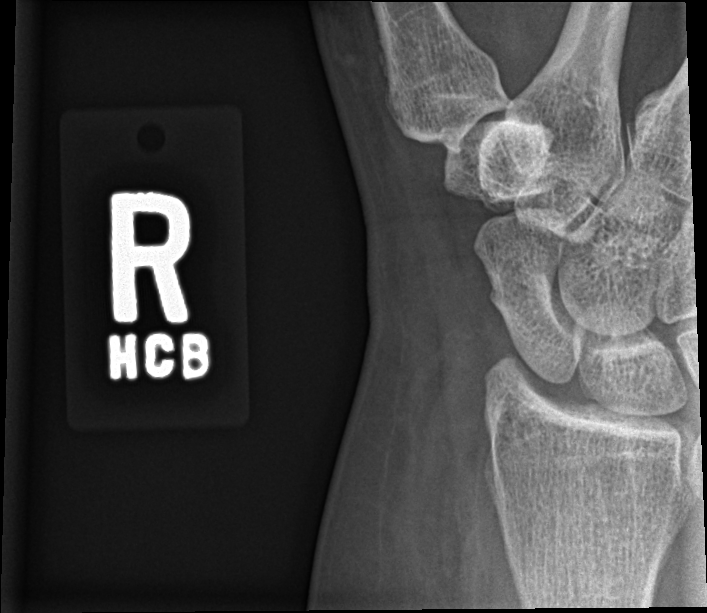

[4 of 4 positions shown; findings below may reference images not displayed]

FINDINGS: There is no evidence of fracture or dislocation. There is no
evidence of arthropathy or other focal bone abnormality. Soft
tissues are unremarkable.
IMPRESSION: Negative.

## 2017-11-26 ENCOUNTER — Other Ambulatory Visit: Payer: Self-pay

## 2017-11-26 ENCOUNTER — Ambulatory Visit
Admission: EM | Admit: 2017-11-26 | Discharge: 2017-11-26 | Disposition: A | Discharge status: Home / Self Care | Payer: BLUE CROSS/BLUE SHIELD | Attending: Family Medicine | Admitting: Family Medicine

## 2017-11-26 ENCOUNTER — Encounter: Payer: Self-pay | Admitting: Gynecology

## 2017-11-26 DIAGNOSIS — M5431 Sciatica, right side: Secondary | ICD-10-CM | POA: Diagnosis not present

## 2017-11-26 MED ORDER — CYCLOBENZAPRINE HCL 5 MG PO TABS: 5.0000 mg | ORAL_TABLET | Freq: Three times a day (TID) | ORAL | 0 refills | Status: DC | PRN

## 2017-11-26 MED ORDER — PREDNISONE 10 MG PO TABS: ORAL_TABLET | ORAL | 0 refills | Status: DC

## 2017-11-26 NOTE — ED Triage Notes (Signed)
Patient c/o right leg pain x 2 weeks. Per patient no injury to her right leg.

## 2017-11-26 NOTE — ED Provider Notes (Signed)
[facility]    CSN: [id number] Arrival date & time: 11/26/17  1538     History   Chief Complaint Chief Complaint  Patient presents with  . Leg Pain    HPI Tammy Mahoney is a 57 y.o. female presents to the urgent care facility for evaluation of right leg pain.  She describes 2 weeks of pain burning numbness and tingling going down her right posterior thigh, calf and into the lateral aspect of her right foot.  She denies any trauma or injury.  No abdominal pain.  She denies any swelling throughout the lower extremity.  Patient states her pain is increased with standing.  She was initially getting some relief with ibuprofen but over the last few days has not had any relief.  Pain is moderate.  She denies any loss of bowel or bladder symptoms.  She denies any history of back issues.  She does have some intermittent tightness in her lower back and she describes spasms.  HPI  Past Medical History:  Diagnosis Date  . Hypertension     There are no active problems to display for this patient.   Past Surgical History:  Procedure Laterality Date  . COLONOSCOPY WITH PROPOFOL N/A 02/15/2016   Procedure: COLONOSCOPY WITH PROPOFOL;  Surgeon: Lollie Sails, MD;  Location: [facility] ENDOSCOPY;  Service: Endoscopy;  Laterality: N/A;    OB History   None      Home Medications    Prior to Admission medications   Medication Sig Start Date End Date Taking? Authorizing Provider  amLODipine (NORVASC) 5 MG tablet Take 5 mg by mouth daily.   Yes [provider]  metoprolol-hydrochlorothiazide (LOPRESSOR HCT) 100-25 MG tablet Take 1 tablet by mouth daily.   Yes [provider]  amoxicillin (AMOXIL) 875 MG tablet Take 1 tablet (875 mg total) by mouth 2 (two) times daily. 05/05/17   Norval Gable, MD  aspirin EC 81 MG tablet Take by mouth.    [provider]  cyclobenzaprine (FLEXERIL) 5 MG tablet Take 1-2 tablets (5-10 mg total) by mouth 3 (three) times  daily as needed for muscle spasms. 11/26/17   Duanne Guess, PA-C  lidocaine (XYLOCAINE) 2 % solution 20 ml gargle and spit q 6 hours as needed 05/05/17   Norval Gable, MD  meloxicam (MOBIC) 15 MG tablet Take 1 tablet (15 mg total) by mouth daily. Patient not taking: Reported on 02/15/2016 08/29/15   Frederich Cha, MD  predniSONE (DELTASONE) 10 MG tablet 10 day taper. 5,5,4,4,3,3,2,2,1,1 11/26/17   Duanne Guess, PA-C  traMADol (ULTRAM) 50 MG tablet Take 1 tablet (50 mg total) by mouth every 12 (twelve) hours as needed for moderate pain or severe pain (Many take at night for pain may cause sedation during the day). Patient not taking: Reported on 02/15/2016 08/29/15   Frederich Cha, MD    Family History Family History  Problem Relation Age of Onset  . Cancer Father     Social History Social History   Tobacco Use  . Smoking status: Never Smoker  . Smokeless tobacco: Never Used  Substance Use Topics  . Alcohol use: No  . Drug use: No     Allergies   Patient has no known allergies.   Review of Systems Review of Systems  Constitutional: Negative for activity change.  Eyes: Negative for pain and visual disturbance.  Respiratory: Negative for shortness of breath.   Cardiovascular: Negative for chest pain and leg swelling.  Gastrointestinal: Negative  for abdominal pain.  Genitourinary: Negative for flank pain and pelvic pain.  Musculoskeletal: Positive for back pain. Negative for arthralgias, gait problem, joint swelling, myalgias, neck pain and neck stiffness.  Skin: Negative for wound.  Neurological: Positive for numbness. Negative for dizziness, syncope, weakness, light-headedness and headaches.  Psychiatric/Behavioral: Negative for confusion and decreased concentration.     Physical Exam Triage Vital Signs ED Triage Vitals  Enc Vitals Group     BP 11/26/17 1546 128/87     Pulse Rate 11/26/17 1546 75     Resp 11/26/17 1546 16     Temp 11/26/17 1546 97.7 F (36.5 C)      Temp Source 11/26/17 1546 Oral     SpO2 11/26/17 1546 99 %     Weight 11/26/17 1545 285 lb (129.3 kg)     Height 11/26/17 1545 5\' 4"  (1.626 m)     Head Circumference --      Peak Flow --      Pain Score 11/26/17 1545 8     Pain Loc --      Pain Edu? --      Excl. in [place]? --    No data found.  Updated Vital Signs BP 128/87 (BP Location: Right Leg)   Pulse 75   Temp 97.7 F (36.5 C) (Oral)   Resp 16   Ht 5\' 4"  (1.626 m)   Wt 285 lb (129.3 kg)   LMP 04/05/2017   SpO2 99%   BMI 48.92 kg/m   Visual Acuity Right Eye Distance:   Left Eye Distance:   Bilateral Distance:    Right Eye Near:   Left Eye Near:    Bilateral Near:     Physical Exam  Constitutional: She is oriented to person, place, and time. She appears well-developed and well-nourished.  HENT:  Head: Normocephalic and atraumatic.  Right Ear: External ear normal.  Left Ear: External ear normal.  Nose: Nose normal.  Eyes: Pupils are equal, round, and reactive to light. Conjunctivae and EOM are normal.  Neck: Normal range of motion.  Cardiovascular: Normal rate.  Pulmonary/Chest: Effort normal and breath sounds normal. No respiratory distress.  Abdominal: Soft. There is no tenderness.  Musculoskeletal:  Lumbar Spine: Examination of the lumbar spine reveals no bony abnormality, no edema, and no ecchymosis.  There is no step off.  The patient has decreased range of motion of the lumbar spine with flexion and extension.  The patient has normal lateral bend and rotation.  The patient has no pain with range of motion activities.  The patient has a negative axial load test, and a negative rotational Waddell test.  The patient is non tender along the spinous process.  The patient is non tender along the paravertebral muscles, with no muscle spasms.  The patient is non tender along the iliac crest.  The patient is non tender in the sciatic notch.  The patient is non tender along the Sacroiliac joint.  There is no Coccyx joint  tenderness.    Bilateral Lower Extremities: Examination of the lower extremities reveals no bony abnormality, no edema, and no ecchymosis.  The patient has full active and passive range of motion of the hips, knees, and ankles.  There is no discomfort with range of motion exercises.  The patient is non tender along the greater trochanter region.  The patient has a negative Bevelyn Buckles' test bilaterally.  There is normal skin warmth.  There is normal capillary refill bilaterally.    Neurologic: The  patient has a negative straight leg raise.  The patient has normal muscle strength testing for the quadriceps, calves, ankle dorsiflexion, ankle plantarflexion, and extensor hallicus longus.  The patient has sensation that is intact to light touch.     Neurological: She is alert and oriented to person, place, and time. No cranial nerve deficit. Coordination normal.  Skin: Skin is warm and dry. No rash noted.  Psychiatric: She has a normal mood and affect. Her behavior is normal.     UC Treatments / Results  Labs (all labs ordered are listed, but only abnormal results are displayed) Labs Reviewed - No data to display  EKG None  Radiology No results found.  Procedures Procedures (including critical care time)  Medications Ordered in UC Medications - No data to display  Initial Impression / Assessment and Plan / UC Course  I have reviewed the triage vital signs and the nursing notes.  Pertinent labs & imaging results that were available during my care of the patient were reviewed by me and considered in my medical decision making (see chart for details).     57 year old female with right ankle.  S1 radiculopathy.  No weakness or neurological deficits.  Pain is moderate.  Initially resolved with ibuprofen but now no relief with ibuprofen.  Patient is placed on a 10-day steroid taper.  She is given Flexeril to help with muscle tightness.  She will follow-up with orthopedics if no improvement in  7 to 10 days.  She understands signs symptoms return to clinic for. Final Clinical Impressions(s) / UC Diagnoses   Final diagnoses:  Sciatica of right side     Discharge Instructions     Please take prednisone as prescribed.  Use Flexeril as needed for any muscle tightness or spasms.  Use a heating pad to the lower back.  Follow-up with orthopedics if no improvement in 10 days.   ED Prescriptions    Medication Sig Dispense Auth. Provider   predniSONE (DELTASONE) 10 MG tablet 10 day taper. 5,5,4,4,3,3,2,2,1,1 30 tablet Dorise Hiss C, PA-C   cyclobenzaprine (FLEXERIL) 5 MG tablet Take 1-2 tablets (5-10 mg total) by mouth 3 (three) times daily as needed for muscle spasms. 20 tablet Renata Caprice       Duanne Guess, [place] 11/26/17 1600

## 2017-11-26 NOTE — Discharge Instructions (Addendum)
Please take prednisone as prescribed.  Use Flexeril as needed for any muscle tightness or spasms.  Use a heating pad to the lower back.  Follow-up with orthopedics if no improvement in 10 days.

## 2018-12-23 ENCOUNTER — Ambulatory Visit
Admission: EM | Admit: 2018-12-23 | Discharge: 2018-12-23 | Disposition: A | Discharge status: Home / Self Care | Payer: BLUE CROSS/BLUE SHIELD | Attending: Family Medicine | Admitting: Family Medicine

## 2018-12-23 DIAGNOSIS — L239 Allergic contact dermatitis, unspecified cause: Secondary | ICD-10-CM

## 2018-12-23 DIAGNOSIS — I10 Essential (primary) hypertension: Secondary | ICD-10-CM

## 2018-12-23 LAB — BASIC METABOLIC PANEL
Anion gap: 9 (ref 5–15)
BUN: 16 mg/dL (ref 6–20)
CO2: 25 mmol/L (ref 22–32)
Calcium: 9 mg/dL (ref 8.9–10.3)
Chloride: 102 mmol/L (ref 98–111)
Creatinine, Ser: 0.47 mg/dL (ref 0.44–1.00)
GFR calc Af Amer: >60 mL/min (ref >60)
GFR calc non Af Amer: >60 mL/min (ref >60)
Glucose, Bld: 114 mg/dL — ABNORMAL HIGH (ref 70–99)
Potassium: 3.5 mmol/L (ref 3.5–5.1)
Sodium: 136 mmol/L (ref 135–145)

## 2018-12-23 MED ORDER — CLONIDINE HCL 0.2 MG PO TABS
0.2000 mg | ORAL_TABLET | Freq: Once | ORAL | Status: AC
  Administered 2018-12-23: 0.2 mg via ORAL

## 2018-12-23 MED ORDER — HYDROCHLOROTHIAZIDE 25 MG PO TABS: 25.0000 mg | ORAL_TABLET | Freq: Every day | ORAL | 0 refills | Status: DC

## 2018-12-23 MED ORDER — PREDNISONE 20 MG PO TABS: ORAL_TABLET | ORAL | 0 refills | Status: DC

## 2018-12-23 NOTE — ED Provider Notes (Signed)
[facility]    CSN: [id number] Arrival date & time: 12/23/18  1542     History   Chief Complaint Chief Complaint  Patient presents with  . Rash    HPI Tammy Mahoney is a 58 y.o. female.   58 yo female with a c/o a itchy rash to both her arms and neck area for the past week. States she's tried otc medications without relief. States she used some new detergent prior to rash developing. Denies sick contacts or contact with anyone with an infectious skin condition.  Patient also states that she has h/o blood pressure but has not been taking her blood pressure medicine after she ran out. Denies any chest pains, shortness of breath, numbness/tingling, one-sided weakness.    Rash   Past Medical History:  Diagnosis Date  . Hypertension     There are no active problems to display for this patient.   Past Surgical History:  Procedure Laterality Date  . COLONOSCOPY WITH PROPOFOL N/A 02/15/2016   Procedure: COLONOSCOPY WITH PROPOFOL;  Surgeon: Lollie Sails, MD;  Location: [facility] ENDOSCOPY;  Service: Endoscopy;  Laterality: N/A;    OB History   No obstetric history on file.      Home Medications    Prior to Admission medications   Medication Sig Start Date End Date Taking? Authorizing Provider  amLODipine (NORVASC) 5 MG tablet Take 5 mg by mouth daily.    [provider]  amoxicillin (AMOXIL) 875 MG tablet Take 1 tablet (875 mg total) by mouth 2 (two) times daily. 05/05/17   Norval Gable, MD  aspirin EC 81 MG tablet Take by mouth.    [provider]  cyclobenzaprine (FLEXERIL) 5 MG tablet Take 1-2 tablets (5-10 mg total) by mouth 3 (three) times daily as needed for muscle spasms. 11/26/17   Duanne Guess, PA-C  hydrochlorothiazide (HYDRODIURIL) 25 MG tablet Take 1 tablet (25 mg total) by mouth daily. 12/23/18   Norval Gable, MD  lidocaine (XYLOCAINE) 2 % solution 20 ml gargle and spit q 6 hours as needed 05/05/17   Norval Gable, MD   meloxicam (MOBIC) 15 MG tablet Take 1 tablet (15 mg total) by mouth daily. Patient not taking: Reported on 02/15/2016 08/29/15   Frederich Cha, MD  metoprolol-hydrochlorothiazide (LOPRESSOR HCT) 100-25 MG tablet Take 1 tablet by mouth daily.    [provider]  predniSONE (DELTASONE) 20 MG tablet 3 tabs po once day 1, then 2 tabs po qd x 2 days, then 1 tab po qd x 2 days, then half a tab po qd  2 days 12/23/18   Norval Gable, MD  traMADol (ULTRAM) 50 MG tablet Take 1 tablet (50 mg total) by mouth every 12 (twelve) hours as needed for moderate pain or severe pain (Many take at night for pain may cause sedation during the day). Patient not taking: Reported on 02/15/2016 08/29/15   Frederich Cha, MD    Family History Family History  Problem Relation Age of Onset  . Cancer Father     Social History Social History   Tobacco Use  . Smoking status: Never Smoker  . Smokeless tobacco: Never Used  Substance Use Topics  . Alcohol use: No  . Drug use: No     Allergies   Patient has no known allergies.   Review of Systems Review of Systems  Skin: Positive for rash.     Physical Exam Triage Vital Signs ED Triage Vitals  Enc Vitals Group  BP 12/23/18 1551 (S) (!) 207/107     Pulse Rate 12/23/18 1551 95     Resp 12/23/18 1551 18     Temp 12/23/18 1551 98.5 F (36.9 C)     Temp Source 12/23/18 1551 Oral     SpO2 12/23/18 1551 99 %     Weight 12/23/18 1553 (!) 305 lb (138.3 kg)     Height --      Head Circumference --      Peak Flow --      Pain Score 12/23/18 1551 0     Pain Loc --      Pain Edu? --      Excl. in [place]? --    No data found.  Updated Vital Signs BP (!) 180/112 (BP Location: Right Arm)   Pulse 81   Temp 98.5 F (36.9 C) (Oral)   Resp 18   Wt (!) 138.3 kg   LMP 04/05/2017   SpO2 99%   BMI 52.35 kg/m   Visual Acuity Right Eye Distance:   Left Eye Distance:   Bilateral Distance:    Right Eye Near:   Left Eye Near:    Bilateral Near:      Physical Exam Vitals signs and nursing note reviewed.  Constitutional:      General: She is not in acute distress.    Appearance: She is not toxic-appearing or diaphoretic.  Cardiovascular:     Rate and Rhythm: Normal rate and regular rhythm.  Pulmonary:     Effort: Pulmonary effort is normal. No respiratory distress.     Breath sounds: Normal breath sounds. No stridor.  Skin:    Findings: Erythema and rash present. Rash is vesicular.     Comments: Erythematous vesicular rash on both forearms, upper arms and left neck area  Neurological:     General: No focal deficit present.     Mental Status: She is alert.      UC Treatments / Results  Labs (all labs ordered are listed, but only abnormal results are displayed) Labs Reviewed  BASIC METABOLIC PANEL - Abnormal; Notable for the following components:      Result Value   Glucose, Bld 114 (*)    All other components within normal limits    EKG None  Radiology No results found.  Procedures Procedures (including critical care time)  Medications Ordered in UC Medications  cloNIDine (CATAPRES) tablet 0.2 mg (0.2 mg Oral Given 12/23/18 1603)    Initial Impression / Assessment and Plan / UC Course  I have reviewed the triage vital signs and the nursing notes.  Pertinent labs & imaging results that were available during my care of the patient were reviewed by me and considered in my medical decision making (see chart for details).      Final Clinical Impressions(s) / UC Diagnoses   Final diagnoses:  Allergic contact dermatitis, unspecified trigger  Essential hypertension    ED Prescriptions    Medication Sig Dispense Auth. Provider   predniSONE (DELTASONE) 20 MG tablet 3 tabs po once day 1, then 2 tabs po qd x 2 days, then 1 tab po qd x 2 days, then half a tab po qd  2 days 10 tablet Levenia Skalicky, Linward Foster, MD   hydrochlorothiazide (HYDRODIURIL) 25 MG tablet Take 1 tablet (25 mg total) by mouth daily. 30 tablet Norval Gable, MD     1. Lab results and diagnosis reviewed with patient 2. Patient given clonidine 0.2mg  po x 1 with improvement  of her blood pressure 3. Restart her blood pressure medication;  rx as per orders above; reviewed possible side effects, interactions, risks and benefits  4. Recommend supportive treatment with otc antihistamines prn for rash 5. Follow up with PCP this week for recheck blood pressure 6. Follow-up prn if symptoms worsen or don't improve Controlled Substance Prescriptions [place] Controlled Substance Registry consulted? Not Applicable   Norval Gable, MD 12/23/18 1659

## 2018-12-23 NOTE — ED Triage Notes (Signed)
Pt here for rash for 1 week that's located on both her arms and does itch but is red. States she thought it was originally heat rash but now is thinking it's scabies. No other family members affected. Did say anything she puts on it including cortisone, benadryl or calamine lotion. Did use tide pods recently.

## 2018-12-24 ENCOUNTER — Telehealth: Payer: Self-pay | Admitting: Emergency Medicine

## 2018-12-24 MED ORDER — HYDROCHLOROTHIAZIDE 25 MG PO TABS: 25.0000 mg | ORAL_TABLET | Freq: Every day | ORAL | 0 refills | Status: DC

## 2018-12-24 MED ORDER — PREDNISONE 10 MG (21) PO TBPK: ORAL_TABLET | ORAL | 0 refills | Status: DC

## 2018-12-24 NOTE — Telephone Encounter (Signed)
Wants Rx's printed. Will print Rx's from yesterdays visit.

## 2018-12-31 ENCOUNTER — Ambulatory Visit
Admission: EM | Admit: 2018-12-31 | Discharge: 2018-12-31 | Disposition: A | Discharge status: Home / Self Care | Payer: Self-pay | Attending: Emergency Medicine | Admitting: Emergency Medicine

## 2018-12-31 ENCOUNTER — Other Ambulatory Visit: Payer: Self-pay

## 2018-12-31 ENCOUNTER — Encounter: Payer: Self-pay | Admitting: Emergency Medicine

## 2018-12-31 DIAGNOSIS — L309 Dermatitis, unspecified: Secondary | ICD-10-CM

## 2018-12-31 MED ORDER — DEXAMETHASONE SODIUM PHOSPHATE 10 MG/ML IJ SOLN
10.0000 mg | Freq: Once | INTRAMUSCULAR | Status: AC
  Administered 2018-12-31: 10 mg via INTRAMUSCULAR

## 2018-12-31 MED ORDER — METHYLPREDNISOLONE ACETATE 80 MG/ML IJ SUSP: 80.0000 mg | Freq: Once | INTRAMUSCULAR | Status: DC

## 2018-12-31 MED ORDER — TRIAMCINOLONE ACETONIDE 0.1 % EX CREA: 1.0000 "application " | TOPICAL_CREAM | Freq: Two times a day (BID) | CUTANEOUS | 0 refills | Status: DC

## 2018-12-31 NOTE — ED Provider Notes (Signed)
[facility]    CSN: [id number] Arrival date & time: 12/31/18  1016     History   Chief Complaint Chief Complaint  Patient presents with  . Rash    HPI Tammy Mahoney is a 58 y.o. female.   Tammy Mahoney presents with complaints of itching rash. Was seen here 6/14 and given prednisone taper for the same rash. She feels the rash improved some initially but then has since persisted. Itching has improved some. No pain. Last night felt "tight", took benadryl which helped. Rash also to her face. It is to her arms, neck and face. No specific known allergen exposures. States she had used tide pods, which have at times caused some skin reaction. The rash is not to trunk or legs where her clothing is, however. No pain. No fevers or URI symptoms. Sometimes has some oozing from the antecubital area but otherwise no drainage. States she has had similar rash to antecubital space in the past but typically it resolves. This rash originated to antecubital regions of bilateral arms before spreading. Hx of htn. She took her medications while in the room here today, as she had not taken them prior to arrival.     ROS per HPI, negative if not otherwise mentioned.      Past Medical History:  Diagnosis Date  . Hypertension     There are no active problems to display for this patient.   Past Surgical History:  Procedure Laterality Date  . COLONOSCOPY WITH PROPOFOL N/A 02/15/2016   Procedure: COLONOSCOPY WITH PROPOFOL;  Surgeon: Lollie Sails, MD;  Location: [facility] ENDOSCOPY;  Service: Endoscopy;  Laterality: N/A;    OB History   No obstetric history on file.      Home Medications    Prior to Admission medications   Medication Sig Start Date End Date Taking? Authorizing Provider  amLODipine (NORVASC) 5 MG tablet Take 5 mg by mouth daily.   Yes [provider]  aspirin EC 81 MG tablet Take by mouth.   Yes [provider]  hydrochlorothiazide  (HYDRODIURIL) 25 MG tablet Take 1 tablet (25 mg total) by mouth daily. 12/23/18  Yes Norval Gable, MD  hydrochlorothiazide (HYDRODIURIL) 25 MG tablet Take 1 tablet (25 mg total) by mouth daily. 12/24/18  Yes Cook, Jayce G, DO  lidocaine (XYLOCAINE) 2 % solution 20 ml gargle and spit q 6 hours as needed 05/05/17   Norval Gable, MD  meloxicam (MOBIC) 15 MG tablet Take 1 tablet (15 mg total) by mouth daily. Patient not taking: Reported on 02/15/2016 08/29/15   Frederich Cha, MD  metoprolol-hydrochlorothiazide (LOPRESSOR HCT) 100-25 MG tablet Take 1 tablet by mouth daily.    [provider]  triamcinolone cream (KENALOG) 0.1 % Apply 1 application topically 2 (two) times daily. 12/31/18   Zigmund Gottron, NP    Family History Family History  Problem Relation Age of Onset  . Cancer Father     Social History Social History   Tobacco Use  . Smoking status: Never Smoker  . Smokeless tobacco: Never Used  Substance Use Topics  . Alcohol use: No  . Drug use: No     Allergies   Patient has no known allergies.   Review of Systems Review of Systems   Physical Exam Triage Vital Signs ED Triage Vitals  Enc Vitals Group     BP 12/31/18 1029 (!) 182/114     Pulse Rate 12/31/18 1029 89     Resp  12/31/18 1029 18     Temp 12/31/18 1029 98.1 F (36.7 C)     Temp Source 12/31/18 1029 Oral     SpO2 12/31/18 1029 98 %     Weight 12/31/18 1025 (!) 305 lb (138.3 kg)     Height 12/31/18 1025 5\' 4"  (1.626 m)     Head Circumference --      Peak Flow --      Pain Score 12/31/18 1025 2     Pain Loc --      Pain Edu? --      Excl. in [place]? --    No data found.  Updated Vital Signs BP (!) 182/114 (BP Location: Left Arm)   Pulse 89   Temp 98.1 F (36.7 C) (Oral)   Resp 18   Ht 5\' 4"  (1.626 m)   Wt (!) 305 lb (138.3 kg)   LMP 04/05/2017   SpO2 98%   BMI 52.35 kg/m   Visual Acuity Right Eye Distance:   Left Eye Distance:   Bilateral Distance:    Right Eye Near:   Left Eye  Near:    Bilateral Near:     Physical Exam Constitutional:      General: She is not in acute distress.    Appearance: She is well-developed.  Cardiovascular:     Rate and Rhythm: Normal rate.  Pulmonary:     Effort: Pulmonary effort is normal.  Skin:    General: Skin is warm and dry.     Comments: Raised red and hyperpigmented rash with scaling as well as with papules to bilateral forearms and to neck fold, some to forehead and hair line; rash ends at proximal arms where tshirt hits arm; extends down forearms to wrist; non tender; no active drainage; antecubitals more smooth and shiny in appearance  Neurological:     Mental Status: She is alert and oriented to person, place, and time.      UC Treatments / Results  Labs (all labs ordered are listed, but only abnormal results are displayed) Labs Reviewed - No data to display  EKG None  Radiology No results found.  Procedures Procedures (including critical care time)  Medications Ordered in UC Medications  dexamethasone (DECADRON) injection 10 mg (has no administration in time range)    Initial Impression / Assessment and Plan / UC Course  I have reviewed the triage vital signs and the nursing notes.  Pertinent labs & imaging results that were available during my care of the patient were reviewed by me and considered in my medical decision making (see chart for details).     Remains consistent with a dermatitis, less likely from detergent due to pattern of rash. Question if this is eczema related as she has had in the past but typically it is better controlled? Topical kenalog as well as IM dexamethasone provided here today as just finished a prednisone taper. Skin care discussed with return precautions. Patient verbalized understanding and agreeable to plan.    Final Clinical Impressions(s) / UC Diagnoses   Final diagnoses:  Dermatitis     Discharge Instructions     Use of kenalog cream twice a day to affected  areas.  Avoid long hot showers, keep luke warm and as short as possible.  Use of high moisture cream such as eucerin with colloidal oatmeal may be helpful, use of emollient, especially after bathing and before bed such as aquaphor to help promote moisture and help with itching.  If worsening,  becomes painful, pus drainage, fevers, please return to be seen. Continue to follow up or follow with your PCP if persistent.     ED Prescriptions    Medication Sig Dispense Auth. Provider   triamcinolone cream (KENALOG) 0.1 % Apply 1 application topically 2 (two) times daily. 30 g Zigmund Gottron, NP     Controlled Substance Prescriptions [place] Controlled Substance Registry consulted? Not Applicable   Zigmund Gottron, NP 12/31/18 1101

## 2018-12-31 NOTE — ED Triage Notes (Signed)
Patient states she was here a week ago with a rash on her arms.  It has spread to her chest and face. Still red and itchy

## 2018-12-31 NOTE — Discharge Instructions (Signed)
Use of kenalog cream twice a day to affected areas.  Avoid long hot showers, keep luke warm and as short as possible.  Use of high moisture cream such as eucerin with colloidal oatmeal may be helpful, use of emollient, especially after bathing and before bed such as aquaphor to help promote moisture and help with itching.  If worsening, becomes painful, pus drainage, fevers, please return to be seen. Continue to follow up or follow with your PCP if persistent.

## 2020-02-14 ENCOUNTER — Encounter: Payer: Self-pay | Admitting: Emergency Medicine

## 2020-02-14 ENCOUNTER — Other Ambulatory Visit: Payer: Self-pay

## 2020-02-14 ENCOUNTER — Ambulatory Visit
Admission: EM | Admit: 2020-02-14 | Discharge: 2020-02-14 | Disposition: A | Discharge status: Home / Self Care | Payer: Self-pay | Attending: Family Medicine | Admitting: Family Medicine

## 2020-02-14 DIAGNOSIS — R05 Cough: Secondary | ICD-10-CM

## 2020-02-14 DIAGNOSIS — R059 Cough, unspecified: Secondary | ICD-10-CM

## 2020-02-14 MED ORDER — HYDROCOD POLST-CPM POLST ER 10-8 MG/5ML PO SUER: 5.0000 mL | Freq: Two times a day (BID) | ORAL | 0 refills | Status: DC | PRN

## 2020-02-14 NOTE — ED Provider Notes (Signed)
[facility]    CSN: [id number] Arrival date & time: 02/14/20  1132      History   Chief Complaint Chief Complaint  Patient presents with  . Cough   HPI  59 year old female presents with cough.  Patient reports a cough for the past 2 weeks.  Seems to be worse at night.  No fever.  No reported sick contact.  She has taken several over-the-counter medications without relief.  No known exacerbating factors other than being worse at night.  No other associated symptoms.  No other complaints.  Past Medical History:  Diagnosis Date  . Hypertension    Past Surgical History:  Procedure Laterality Date  . COLONOSCOPY WITH PROPOFOL N/A 02/15/2016   Procedure: COLONOSCOPY WITH PROPOFOL;  Surgeon: Lollie Sails, MD;  Location: [facility] ENDOSCOPY;  Service: Endoscopy;  Laterality: N/A;   OB History   No obstetric history on file.    Home Medications    Prior to Admission medications   Medication Sig Start Date End Date Taking? Authorizing Provider  amLODipine (NORVASC) 5 MG tablet Take 5 mg by mouth daily.   Yes [provider]  aspirin EC 81 MG tablet Take by mouth.   Yes [provider]  hydrochlorothiazide (HYDRODIURIL) 25 MG tablet Take 1 tablet (25 mg total) by mouth daily. 12/23/18  Yes Norval Gable, MD  chlorpheniramine-HYDROcodone (TUSSIONEX PENNKINETIC ER) 10-8 MG/5ML SUER Take 5 mLs by mouth every 12 (twelve) hours as needed. 02/14/20   Coral Spikes, DO  triamcinolone cream (KENALOG) 0.1 % Apply 1 application topically 2 (two) times daily. 12/31/18   Zigmund Gottron, NP  metoprolol-hydrochlorothiazide (LOPRESSOR HCT) 100-25 MG tablet Take 1 tablet by mouth daily.  02/14/20  [provider]    Family History Family History  Problem Relation Age of Onset  . Cancer Father     Social History Social History   Tobacco Use  . Smoking status: Never Smoker  . Smokeless tobacco: Never Used  Vaping Use  . Vaping Use: Never used  Substance  Use Topics  . Alcohol use: No  . Drug use: No     Allergies   Patient has no known allergies.   Review of Systems Review of Systems  Constitutional: Negative for fever.  Respiratory: Positive for cough.    Physical Exam Triage Vital Signs ED Triage Vitals  Enc Vitals Group     BP 02/14/20 1217 (!) 158/94     Pulse Rate 02/14/20 1217 83     Resp 02/14/20 1217 16     Temp 02/14/20 1217 98 F (36.7 C)     Temp Source 02/14/20 1217 Oral     SpO2 02/14/20 1217 98 %     Weight 02/14/20 1214 (!) 305 lb (138.3 kg)     Height 02/14/20 1214 5\' 4"  (1.626 m)     Head Circumference --      Peak Flow --      Pain Score 02/14/20 1214 0     Pain Loc --      Pain Edu? --      Excl. in [place]? --    Updated Vital Signs BP (!) 158/94 (BP Location: Right Arm)   Pulse 83   Temp 98 F (36.7 C) (Oral)   Resp 16   Ht 5\' 4"  (1.626 m)   Wt (!) 138.3 kg   LMP 04/05/2017   SpO2 98%   BMI 52.35 kg/m   Visual Acuity Right Eye Distance:  Left Eye Distance:   Bilateral Distance:    Right Eye Near:   Left Eye Near:    Bilateral Near:     Physical Exam Constitutional:      General: She is not in acute distress.    Appearance: Normal appearance. She is obese. She is not ill-appearing.  HENT:     Head: Normocephalic and atraumatic.  Eyes:     General:        Right eye: No discharge.        Left eye: No discharge.     Conjunctiva/sclera: Conjunctivae normal.  Cardiovascular:     Rate and Rhythm: Normal rate and regular rhythm.     Heart sounds: No murmur heard.   Pulmonary:     Effort: Pulmonary effort is normal.     Breath sounds: Normal breath sounds. No wheezing, rhonchi or rales.  Neurological:     Mental Status: She is alert.  Psychiatric:        Mood and Affect: Mood normal.        Behavior: Behavior normal.    UC Treatments / Results  Labs (all labs ordered are listed, but only abnormal results are displayed) Labs Reviewed - No data to  display  EKG   Radiology No results found.  Procedures Procedures (including critical care time)  Medications Ordered in UC Medications - No data to display  Initial Impression / Assessment and Plan / UC Course  I have reviewed the triage vital signs and the nursing notes.  Pertinent labs & imaging results that were available during my care of the patient were reviewed by me and considered in my medical decision making (see chart for details).    59 year old female presents with cough.  Exam unremarkable.  Afebrile.  Tussionex as directed.  Supportive care.  Final Clinical Impressions(s) / UC Diagnoses   Final diagnoses:  Cough     Discharge Instructions     Cough medication as directed.  If you worsen or fail to improve, please let me know  Take care  Dr. Lacinda Axon    ED Prescriptions    Medication Sig [place]. Provider   chlorpheniramine-HYDROcodone (TUSSIONEX PENNKINETIC ER) 10-8 MG/5ML SUER Take 5 mLs by mouth every 12 (twelve) hours as needed. 115 mL Coral Spikes, DO     PDMP not reviewed this encounter.   Coral Spikes, [place] 02/14/20 1505

## 2020-02-14 NOTE — ED Triage Notes (Signed)
Patient c/o cough and chest congestion for 2 weeks.  Patient denies fevers.  

## 2020-02-14 NOTE — Discharge Instructions (Signed)
Cough medication as directed.  If you worsen or fail to improve, please let me know  Take care  Dr. Lacinda Axon

## 2023-08-09 ENCOUNTER — Ambulatory Visit
Admission: EM | Admit: 2023-08-09 | Discharge: 2023-08-09 | Payer: Self-pay | Attending: Family Medicine | Admitting: Family Medicine

## 2023-08-09 ENCOUNTER — Encounter: Payer: Self-pay | Admitting: Emergency Medicine

## 2023-08-09 DIAGNOSIS — R0602 Shortness of breath: Secondary | ICD-10-CM

## 2023-08-09 DIAGNOSIS — M7989 Other specified soft tissue disorders: Secondary | ICD-10-CM

## 2023-08-09 DIAGNOSIS — I10 Essential (primary) hypertension: Secondary | ICD-10-CM

## 2023-08-09 NOTE — ED Triage Notes (Addendum)
Patient presents with c/o dyspnea at rest and with exertion. Patient also c/o pain and edema fo the RLE x 1 day. Denies chest pain or dizziness. Patient states she stopped her BP med two months ago because she lost her insurance.

## 2023-08-09 NOTE — ED Provider Notes (Signed)
[facility]    CSN: [id number] Arrival date & time: 08/09/23  0851      History   Chief Complaint Chief Complaint  Patient presents with   Shortness of Breath   Leg Swelling    HPI Tammy Mahoney is a 63 y.o. female.   HPI  History obtained from the patient. Tammy Mahoney presents for shortness of breath on exertion while walking down the long hallway at work.  Denies chest pain, neck pain or jaw pain but endorses pain between her scapula. She felt dizzy and got really hot. Had some nausea but no vomiting. She didn't break out into a sweat.  Walking into the building today she got shortness of breath.  This is new for her. Her right leg has been swollen for the past week or two. Put a sock on to help the swelling in her leg but this left a compression in her leg.  No history of blood clots in legs or lungs. Denies recent surgery, travel or cancer. She is not immunocompromised.    Has type 2 diabetes,  hypertension and takes aspirin.  She doesn't see a cardiology. She has a PCP but hasn't been in the past year.    Past Medical History:  Diagnosis Date   Hypertension     There are no active problems to display for this patient.   Past Surgical History:  Procedure Laterality Date   COLONOSCOPY WITH PROPOFOL N/A 02/15/2016   Procedure: COLONOSCOPY WITH PROPOFOL;  Surgeon: Christena Deem, MD;  Location: [facility] ENDOSCOPY;  Service: Endoscopy;  Laterality: N/A;    OB History   No obstetric history on file.      Home Medications    Prior to Admission medications   Medication Sig Start Date End Date Taking? Authorizing Provider  aspirin EC 81 MG tablet Take by mouth.    [provider]  metoprolol-hydrochlorothiazide (LOPRESSOR HCT) 100-25 MG tablet Take 1 tablet by mouth daily.  02/14/20  [provider]    Family History Family History  Problem Relation Age of Onset   Cancer Father     Social History Social History   Tobacco Use    Smoking status: Never   Smokeless tobacco: Never  Vaping Use   Vaping status: Never Used  Substance Use Topics   Alcohol use: No   Drug use: No     Allergies   Patient has no known allergies.   Review of Systems Review of Systems: negative unless otherwise stated in HPI.      Physical Exam Triage Vital Signs ED Triage Vitals  Encounter Vitals Group     BP 08/09/23 0941 (!) 160/110     Systolic BP Percentile --      Diastolic BP Percentile --      Pulse Rate 08/09/23 0941 76     Resp 08/09/23 0941 20     Temp 08/09/23 0941 97.9 F (36.6 C)     Temp Source 08/09/23 0941 Oral     SpO2 08/09/23 0941 95 %     Weight --      Height --      Head Circumference --      Peak Flow --      Pain Score 08/09/23 0945 0     Pain Loc --      Pain Education --      Exclude from Growth Chart --    No data found.  Updated Vital Signs BP (!) 160/110 (  BP Location: Left Arm)   Pulse 76   Temp 97.9 F (36.6 C) (Oral)   Resp 20   LMP 04/05/2017   SpO2 95%   Visual Acuity Right Eye Distance:   Left Eye Distance:   Bilateral Distance:    Right Eye Near:   Left Eye Near:    Bilateral Near:     Physical Exam GEN:     alert, non-toxic appearing female    HENT:  mucus membranes moist, no scleral injection or discharge RESP:  no increased work of breathing, difficulty to hear due to body habitus but what heard is clear to auscultation bilaterally CVS:   regular rate and rhythm MSK:  RLE > LLE edema with compression depression on the lower right leg, right calf TTP  Skin:   warm and dry     UC Treatments / Results  Labs (all labs ordered are listed, but only abnormal results are displayed) Labs Reviewed - No data to display  EKG   Radiology No results found.  Procedures Procedures (including critical care time)  Medications Ordered in UC Medications - No data to display  Initial Impression / Assessment and Plan / UC Course  I have reviewed the triage vital  signs and the nursing notes.  Pertinent labs & imaging results that were available during my care of the patient were reviewed by me and considered in my medical decision making (see chart for details).      Patient is a 63 y.o. female with history of T2DM, HTN and obesity who presents with DOE and right lower leg swelling.  He is hypertensive here at 160/110 satting at 94% on room air.  She has not seen her PCP in over a year. Denies history of T2DM and stopped taking her hypertensive medication 2 months ago after losing insurance.   Differential diagnosis includes, but is not limited to, ACS, pulmonary embolism,aortic dissection, cardiac tamponade, pneumothorax, pneumonia, pericarditis/myocarditis, GI-related causes including esophagitis/gastritis, and musculoskeletal chest wall pain   Suzan has no history of DVT/PE.  Unable to [facility] pt out.  EKG obtained and is without ST elevations or concern for ischemia, NSR; age-indeterminate anterior infarct. No priors available for review.  Given her dyspnea on exertion, leg swelling and calf tenderness recommended ED evaluation for cardiac workup and to rule out PE/DVT.  Urgent care work up truncated.  Her son was updated via telephone while in the exam room.  She would liek to drive herself to the [facility] ED for further evaluation.   Discussed MDM, treatment plan and plan for follow-up with patient who agrees with plan.     Final Clinical Impressions(s) / UC Diagnoses   Final diagnoses:  Shortness of breath  Leg swelling  Elevated blood pressure reading with diagnosis of hypertension     Discharge Instructions      You are short of breathe with exertion with right leg swelling. You had an abnormal EKG.   You have been advised to follow up immediately in the emergency department for concerning signs or symptoms as discussed during your visit. If you declined EMS transport, please have a family member take you directly to the ED at  this time. Do not delay.   Based on concerns about condition, if you do not follow up in the ED, you may risk poor outcomes including worsening of condition, delayed treatment and potentially life threatening issues. If you have declined to go to the ED at this time, you should call  your PCP immediately to set up a follow up appointment.   Go to ED for red flag symptoms, including; fevers you cannot reduce with Tylenol/Motrin, severe headaches, vision changes, numbness/weakness in part of the body, lethargy, confusion, intractable vomiting, severe dehydration, chest pain, breathing difficulty, severe persistent abdominal or pelvic pain, signs of severe infection (increased redness, swelling of an area), feeling faint or passing out, dizziness, etc. You should especially go to the ED for sudden acute worsening of condition if you do not elect to go at this time.       ED Prescriptions   None    PDMP not reviewed this encounter.   Katha Cabal, DO 08/09/23 1109

## 2023-08-09 NOTE — ED Notes (Signed)
Patient is being discharged from the Urgent Care and sent to the Emergency Department via POV . Per Dr. Rachael Darby, patient is in need of higher level of care due to Abnormal EKG. Patient is aware and verbalizes understanding of plan of care.  Vitals:   08/09/23 0941  BP: (!) 160/110  Pulse: 76  Resp: 20  Temp: 97.9 F (36.6 C)  SpO2: 95%

## 2023-08-09 NOTE — Discharge Instructions (Signed)
You are short of breathe with exertion with right leg swelling. You had an abnormal EKG.   You have been advised to follow up immediately in the emergency department for concerning signs or symptoms as discussed during your visit. If you declined EMS transport, please have a family member take you directly to the ED at this time. Do not delay.   Based on concerns about condition, if you do not follow up in the ED, you may risk poor outcomes including worsening of condition, delayed treatment and potentially life threatening issues. If you have declined to go to the ED at this time, you should call your PCP immediately to set up a follow up appointment.   Go to ED for red flag symptoms, including; fevers you cannot reduce with Tylenol/Motrin, severe headaches, vision changes, numbness/weakness in part of the body, lethargy, confusion, intractable vomiting, severe dehydration, chest pain, breathing difficulty, severe persistent abdominal or pelvic pain, signs of severe infection (increased redness, swelling of an area), feeling faint or passing out, dizziness, etc. You should especially go to the ED for sudden acute worsening of condition if you do not elect to go at this time.

## 2023-08-20 ENCOUNTER — Ambulatory Visit
Admission: EM | Admit: 2023-08-20 | Discharge: 2023-08-20 | Disposition: A | Discharge status: Home / Self Care | Payer: Self-pay | Attending: Family Medicine | Admitting: Family Medicine

## 2023-08-20 DIAGNOSIS — R600 Localized edema: Secondary | ICD-10-CM

## 2023-08-20 DIAGNOSIS — I10 Essential (primary) hypertension: Secondary | ICD-10-CM

## 2023-08-20 DIAGNOSIS — L03115 Cellulitis of right lower limb: Secondary | ICD-10-CM

## 2023-08-20 DIAGNOSIS — Z9189 Other specified personal risk factors, not elsewhere classified: Secondary | ICD-10-CM

## 2023-08-20 MED ORDER — DOXYCYCLINE HYCLATE 100 MG PO CAPS: 100.0000 mg | ORAL_CAPSULE | Freq: Two times a day (BID) | ORAL | 0 refills | Status: AC

## 2023-08-20 NOTE — ED Provider Notes (Signed)
 [facility]    CSN: [id number] Arrival date & time: 08/20/23  9074      History   Chief Complaint Chief Complaint  Patient presents with   Leg Swelling    HPI Tammy Mahoney is a 63 y.o. female.   HPI   Tammy Mahoney presents for bilateral leg swelling for over a week now.  She saw her heart doctor yesterday but they didn't give her any medication for the redness or fluid in her leg.  She occasionally puts her legs up when she is at rest.    She did not take her blood pressure medication today. She usually takes her medication before she goes to work but she didn't have to work today. Denies chest pain and shortness of breath.        Past Medical History:  Diagnosis Date   Hypertension     There are no active problems to display for this patient.   Past Surgical History:  Procedure Laterality Date   COLONOSCOPY WITH PROPOFOL  N/A 02/15/2016   Procedure: COLONOSCOPY WITH PROPOFOL ;  Surgeon: Gladis RAYMOND Mariner, MD;  Location: [facility] ENDOSCOPY;  Service: Endoscopy;  Laterality: N/A;    OB History   No obstetric history on file.      Home Medications    Prior to Admission medications   Medication Sig Start Date End Date Taking? Authorizing Provider  carvedilol (COREG) 25 MG tablet Take by mouth. 08/09/23 09/10/23 Yes [provider]  doxycycline  (VIBRAMYCIN ) 100 MG capsule Take 1 capsule (100 mg total) by mouth 2 (two) times daily. 08/20/23  Yes Phong Isenberg, DO  lisinopril (ZESTRIL) 10 MG tablet Take 1 tablet by mouth daily. 05/19/21 09/10/23 Yes [provider]  aspirin EC 81 MG tablet Take by mouth.    [provider]  metoprolol-hydrochlorothiazide  (LOPRESSOR HCT) 100-25 MG tablet Take 1 tablet by mouth daily.  02/14/20  [provider]    Family History Family History  Problem Relation Age of Onset   Cancer Father     Social History Social History   Tobacco Use   Smoking status: Never   Smokeless tobacco: Never   Vaping Use   Vaping status: Never Used  Substance Use Topics   Alcohol use: No   Drug use: No     Allergies   Patient has no known allergies.   Review of Systems Review of Systems: negative unless otherwise stated in HPI.      Physical Exam Triage Vital Signs ED Triage Vitals  Encounter Vitals Group     BP 08/20/23 1006 (!) 192/92     Systolic BP Percentile --      Diastolic BP Percentile --      Pulse Rate 08/20/23 1006 73     Resp 08/20/23 1006 19     Temp 08/20/23 1006 98.3 F (36.8 C)     Temp Source 08/20/23 1006 Oral     SpO2 08/20/23 1006 97 %     Weight --      Height --      Head Circumference --      Peak Flow --      Pain Score 08/20/23 1005 5     Pain Loc --      Pain Education --      Exclude from Growth Chart --    No data found.  Updated Vital Signs BP (!) 160/90 (BP Location: Left Wrist)   Pulse 73   Temp 98.3 F (36.8 C) (  Oral)   Resp 19   LMP 04/05/2017   SpO2 97%   Visual Acuity Right Eye Distance:   Left Eye Distance:   Bilateral Distance:    Right Eye Near:   Left Eye Near:    Bilateral Near:     Physical Exam GEN:     alert, female and no distress    RESP:  clear to auscultation bilaterally, no increased work of breathing  CVS:   regular rate and rhythm EXT:   RLE > LLE edema with serous blistering and erythema to the calf on the right LE with warmth  Skin:   warm and dry, see EXT above       UC Treatments / Results  Labs (all labs ordered are listed, but only abnormal results are displayed) Labs Reviewed - No data to display  EKG  If EKG performed, see my interpretation in the MDM section  Radiology No results found.   Procedures Procedures (including critical care time)  Medications Ordered in UC Medications - No data to display  Initial Impression / Assessment and Plan / UC Course  I have reviewed the triage vital signs and the nursing notes.  Pertinent labs & imaging results that were available  during my care of the patient were reviewed by me and considered in my medical decision making (see chart for details).       Patient is a 63 y.o. female  who presents for right.  Overall patient is nontoxic-appearing and  afebrile. Satting well on room air.  She is hypertensive here at 192/92 and repeat 160/90.  She did not take her medication today.  Advised to take her medication when she gets home.    Exam concerning for cellulitis within venous stasis dermatitis of the RLE.  Treat with doxycycline  BID for 10 days as below.     ED and return precautions given and patient/guardian voiced understanding. Discussed MDM, treatment plan and plan for follow-up with patient who agrees with plan.     Final Clinical Impressions(s) / UC Diagnoses   Final diagnoses:  Elevated blood pressure reading with diagnosis of hypertension  Cellulitis of leg, right  At risk for medication nonadherence  Bilateral lower extremity edema     Discharge Instructions      Take your blood pressure medications when you get home and set an alarm on your phone to take them daily.  Follow up with your heart doctor about whether you should start a fluid medication or not.   Go to your scheduled heart ultrasound/echocardiogram.  Consider purchasing some compression hose. Keep your legs located at rest.   Stop by the pharmacy to pick up your antibiotics.     ED Prescriptions     Medication Sig Dispense Auth. Provider   doxycycline  (VIBRAMYCIN ) 100 MG capsule Take 1 capsule (100 mg total) by mouth 2 (two) times daily. 20 capsule Masa Lubin, DO      PDMP not reviewed this encounter.   Reizel Calzada, DO 08/20/23 1132

## 2023-08-20 NOTE — Discharge Instructions (Signed)
 Take your blood pressure medications when you get home and set an alarm on your phone to take them daily.  Follow up with your heart doctor about whether you should start a fluid medication or not.   Go to your scheduled heart ultrasound/echocardiogram.  Consider purchasing some compression hose. Keep your legs located at rest.   Stop by the pharmacy to pick up your antibiotics.

## 2023-08-20 NOTE — ED Triage Notes (Signed)
 Right leg swelling x 1 week.
